# Patient Record
Sex: Female | Born: 2000 | Race: White | Hispanic: No | Marital: Single
Health system: Southern US, Community
[De-identification: ages and names within clinical notes are randomized; demographics above are authoritative.]

---
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
## 2024-02-07 ENCOUNTER — Ambulatory Visit (HOSPITAL_COMMUNITY)
Admission: EM | Admit: 2024-02-07 | Discharge: 2024-02-08 | Disposition: A | Discharge status: Other Acute Inpatient Hospital | Attending: Nurse Practitioner | Admitting: Nurse Practitioner

## 2024-02-07 DIAGNOSIS — F332 Major depressive disorder, recurrent severe without psychotic features: Secondary | ICD-10-CM | POA: Insufficient documentation

## 2024-02-07 DIAGNOSIS — R45851 Suicidal ideations: Secondary | ICD-10-CM | POA: Insufficient documentation

## 2024-02-07 MED ORDER — ALUM & MAG HYDROXIDE-SIMETH 200-200-20 MG/5ML PO SUSP: 30.0000 mL | ORAL | Status: DC | PRN

## 2024-02-07 MED ORDER — HYDROXYZINE HCL 25 MG PO TABS: 25.0000 mg | ORAL_TABLET | Freq: Three times a day (TID) | ORAL | Status: DC | PRN

## 2024-02-07 MED ORDER — TRAZODONE HCL 50 MG PO TABS: 50.0000 mg | ORAL_TABLET | Freq: Every evening | ORAL | Status: DC | PRN

## 2024-02-07 MED ORDER — LORAZEPAM 2 MG/ML IJ SOLN: 2.0000 mg | Freq: Three times a day (TID) | INTRAMUSCULAR | Status: DC | PRN

## 2024-02-07 MED ORDER — HALOPERIDOL LACTATE 5 MG/ML IJ SOLN: 10.0000 mg | Freq: Three times a day (TID) | INTRAMUSCULAR | Status: DC | PRN

## 2024-02-07 MED ORDER — HALOPERIDOL 5 MG PO TABS: 5.0000 mg | ORAL_TABLET | Freq: Three times a day (TID) | ORAL | Status: DC | PRN

## 2024-02-07 MED ORDER — DIPHENHYDRAMINE HCL 50 MG/ML IJ SOLN: 50.0000 mg | Freq: Three times a day (TID) | INTRAMUSCULAR | Status: DC | PRN

## 2024-02-07 MED ORDER — MAGNESIUM HYDROXIDE 400 MG/5ML PO SUSP: 30.0000 mL | Freq: Every day | ORAL | Status: DC | PRN

## 2024-02-07 MED ORDER — HALOPERIDOL LACTATE 5 MG/ML IJ SOLN: 5.0000 mg | Freq: Three times a day (TID) | INTRAMUSCULAR | Status: DC | PRN

## 2024-02-07 MED ORDER — DIPHENHYDRAMINE HCL 50 MG PO CAPS: 50.0000 mg | ORAL_CAPSULE | Freq: Three times a day (TID) | ORAL | Status: DC | PRN

## 2024-02-07 MED ORDER — ACETAMINOPHEN 325 MG PO TABS: 650.0000 mg | ORAL_TABLET | Freq: Four times a day (QID) | ORAL | Status: DC | PRN

## 2024-02-07 NOTE — BH Assessment (Incomplete)
[id number] 
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
## 2024-02-08 ENCOUNTER — Inpatient Hospital Stay (HOSPITAL_COMMUNITY)
Admission: AD | Admit: 2024-02-08 | Discharge: 2024-02-12 | DRG: 885 | Disposition: A | Discharge status: Home / Self Care | Source: Intra-hospital

## 2024-02-08 ENCOUNTER — Encounter (HOSPITAL_COMMUNITY): Payer: Self-pay | Admitting: Nurse Practitioner

## 2024-02-08 ENCOUNTER — Other Ambulatory Visit: Payer: Self-pay

## 2024-02-08 DIAGNOSIS — Z818 Family history of other mental and behavioral disorders: Secondary | ICD-10-CM | POA: Diagnosis not present

## 2024-02-08 DIAGNOSIS — Z9152 Personal history of nonsuicidal self-harm: Secondary | ICD-10-CM | POA: Diagnosis not present

## 2024-02-08 DIAGNOSIS — Z79899 Other long term (current) drug therapy: Secondary | ICD-10-CM

## 2024-02-08 DIAGNOSIS — R45851 Suicidal ideations: Secondary | ICD-10-CM | POA: Diagnosis present

## 2024-02-08 DIAGNOSIS — F332 Major depressive disorder, recurrent severe without psychotic features: Principal | ICD-10-CM | POA: Diagnosis present

## 2024-02-08 LAB — COMPREHENSIVE METABOLIC PANEL WITH GFR
ALT: 17 U/L (ref 0–44)
AST: 19 U/L (ref 15–41)
Albumin: 3.9 g/dL (ref 3.5–5.0)
Alkaline Phosphatase: 50 U/L (ref 38–126)
Anion gap: 11 (ref 5–15)
BUN: 6 mg/dL (ref 6–20)
CO2: 23 mmol/L (ref 22–32)
Calcium: 9.1 mg/dL (ref 8.9–10.3)
Chloride: 105 mmol/L (ref 98–111)
Creatinine, Ser: 0.55 mg/dL (ref 0.44–1.00)
GFR, Estimated: >60 mL/min (ref >60)
Glucose, Bld: 85 mg/dL (ref 70–99)
Potassium: 3.8 mmol/L (ref 3.5–5.1)
Sodium: 139 mmol/L (ref 135–145)
Total Bilirubin: 1.4 mg/dL — ABNORMAL HIGH (ref 0.0–1.2)
Total Protein: 7.5 g/dL (ref 6.5–8.1)

## 2024-02-08 LAB — POCT URINE DRUG SCREEN - MANUAL ENTRY (I-SCREEN)
POC Amphetamine UR: NOT DETECTED
POC Buprenorphine (BUP): NOT DETECTED
POC Cocaine UR: NOT DETECTED
POC Marijuana UR: NOT DETECTED
POC Methadone UR: NOT DETECTED
POC Methamphetamine UR: NOT DETECTED
POC Morphine: NOT DETECTED
POC Oxazepam (BZO): NOT DETECTED
POC Oxycodone UR: NOT DETECTED
POC Secobarbital (BAR): NOT DETECTED

## 2024-02-08 LAB — MAGNESIUM: Magnesium: 2 mg/dL (ref 1.7–2.4)

## 2024-02-08 LAB — CBC WITH DIFFERENTIAL/PLATELET
Abs Immature Granulocytes: 0.01 K/uL (ref 0.00–0.07)
Basophils Absolute: 0.1 K/uL (ref 0.0–0.1)
Basophils Relative: 1 %
Eosinophils Absolute: 0.4 K/uL (ref 0.0–0.5)
Eosinophils Relative: 6 %
HCT: 38.5 % (ref 36.0–46.0)
Hemoglobin: 12.9 g/dL (ref 12.0–15.0)
Immature Granulocytes: 0 %
Lymphocytes Relative: 41 %
Lymphs Abs: 2.8 K/uL (ref 0.7–4.0)
MCH: 30.3 pg (ref 26.0–34.0)
MCHC: 33.5 g/dL (ref 30.0–36.0)
MCV: 90.4 fL (ref 80.0–100.0)
Monocytes Absolute: 0.5 K/uL (ref 0.1–1.0)
Monocytes Relative: 8 %
Neutro Abs: 3.1 K/uL (ref 1.7–7.7)
Neutrophils Relative %: 44 %
Platelets: 289 K/uL (ref 150–400)
RBC: 4.26 MIL/uL (ref 3.87–5.11)
RDW: 12.6 % (ref 11.5–15.5)
WBC: 7 K/uL (ref 4.0–10.5)
nRBC: 0 % (ref 0.0–0.2)

## 2024-02-08 LAB — ETHANOL: Alcohol, Ethyl (B): <15 mg/dL (ref <15)

## 2024-02-08 LAB — TSH: TSH: 2.543 u[IU]/mL (ref 0.350–4.500)

## 2024-02-08 LAB — LIPID PANEL
Cholesterol: 157 mg/dL (ref 0–200)
HDL: 49 mg/dL (ref >40)
LDL Cholesterol: 102 mg/dL — ABNORMAL HIGH (ref 0–99)
Total CHOL/HDL Ratio: 3.2 ratio
Triglycerides: 30 mg/dL (ref <150)
VLDL: 6 mg/dL (ref 0–40)

## 2024-02-08 LAB — POC URINE PREG, ED: Preg Test, Ur: NEGATIVE

## 2024-02-08 MED ORDER — TRAZODONE HCL 50 MG PO TABS
50.0000 mg | ORAL_TABLET | Freq: Every evening | ORAL | Status: DC | PRN
  Administered 2024-02-08 – 2024-02-11 (×4): 50 mg via ORAL
  Filled 2024-02-08 (×5): qty 1

## 2024-02-08 MED ORDER — DIPHENHYDRAMINE HCL 50 MG/ML IJ SOLN: 50.0000 mg | Freq: Three times a day (TID) | INTRAMUSCULAR | Status: DC | PRN

## 2024-02-08 MED ORDER — LORAZEPAM 2 MG/ML IJ SOLN: 2.0000 mg | Freq: Three times a day (TID) | INTRAMUSCULAR | Status: DC | PRN

## 2024-02-08 MED ORDER — ACETAMINOPHEN 325 MG PO TABS
650.0000 mg | ORAL_TABLET | Freq: Four times a day (QID) | ORAL | Status: DC | PRN
  Administered 2024-02-08: 650 mg via ORAL
  Filled 2024-02-08: qty 2

## 2024-02-08 MED ORDER — MAGNESIUM HYDROXIDE 400 MG/5ML PO SUSP: 30.0000 mL | Freq: Every day | ORAL | Status: DC | PRN

## 2024-02-08 MED ORDER — HYDROXYZINE HCL 25 MG PO TABS
25.0000 mg | ORAL_TABLET | Freq: Three times a day (TID) | ORAL | Status: DC | PRN
  Administered 2024-02-08 – 2024-02-11 (×4): 25 mg via ORAL
  Filled 2024-02-08 (×6): qty 1

## 2024-02-08 MED ORDER — DIPHENHYDRAMINE HCL 25 MG PO CAPS: 50.0000 mg | ORAL_CAPSULE | Freq: Three times a day (TID) | ORAL | Status: DC | PRN

## 2024-02-08 MED ORDER — ALUM & MAG HYDROXIDE-SIMETH 200-200-20 MG/5ML PO SUSP: 30.0000 mL | ORAL | Status: DC | PRN

## 2024-02-08 MED ORDER — ESCITALOPRAM OXALATE 20 MG PO TABS
20.0000 mg | ORAL_TABLET | Freq: Every day | ORAL | Status: DC
  Administered 2024-02-09 – 2024-02-10 (×2): 20 mg via ORAL
  Filled 2024-02-08 (×2): qty 2

## 2024-02-08 MED ORDER — HALOPERIDOL LACTATE 5 MG/ML IJ SOLN: 5.0000 mg | Freq: Three times a day (TID) | INTRAMUSCULAR | Status: DC | PRN

## 2024-02-08 MED ORDER — HALOPERIDOL LACTATE 5 MG/ML IJ SOLN: 10.0000 mg | Freq: Three times a day (TID) | INTRAMUSCULAR | Status: DC | PRN

## 2024-02-08 MED ORDER — BUPROPION HCL ER (XL) 150 MG PO TB24
150.0000 mg | ORAL_TABLET | Freq: Every day | ORAL | Status: DC
  Administered 2024-02-08 – 2024-02-12 (×5): 150 mg via ORAL
  Filled 2024-02-08 (×5): qty 1

## 2024-02-08 MED ORDER — ESCITALOPRAM OXALATE 10 MG PO TABS
20.0000 mg | ORAL_TABLET | Freq: Every day | ORAL | Status: DC
  Filled 2024-02-08: qty 2

## 2024-02-08 MED ORDER — ESCITALOPRAM OXALATE 10 MG PO TABS
20.0000 mg | ORAL_TABLET | Freq: Every day | ORAL | Status: DC
  Administered 2024-02-08: 20 mg via ORAL

## 2024-02-08 MED ORDER — HALOPERIDOL 5 MG PO TABS: 5.0000 mg | ORAL_TABLET | Freq: Three times a day (TID) | ORAL | Status: DC | PRN

## 2024-02-08 NOTE — Group Note (Signed)
 Date:  02/08/2024 Time:  4:21 PM  Group Topic/Focus:  Overcoming Stress:   The focus of this group are the benefits of gratitude on stress.    Participation Level:  Active  Participation Quality:  Appropriate  Affect:  Appropriate  Cognitive:  Appropriate  Insight: Appropriate  Engagement in Group:  Engaged  Modes of Intervention:  Discussion    Vanessa Burgess 02/08/2024, 4:21 PM

## 2024-02-08 NOTE — ED Notes (Signed)
 Pt observed/assessed in recliner sleeping. RR even and unlabored, appearing in no noted distress. Environmental check complete, will continue to monitor for safety

## 2024-02-08 NOTE — ED Notes (Signed)
Report called to Mateo Flow, RN at [facility]

## 2024-02-08 NOTE — Progress Notes (Signed)
   02/08/24 2133  Psych Admission Type (Psych Patients Only)  Admission Status Voluntary  Psychosocial Assessment  Patient Complaints Depression  Eye Contact Fair  Facial Expression Animated  Affect Appropriate to circumstance  Speech Logical/coherent  Interaction Assertive  Motor Activity Other (Comment) (WDL)  Appearance/Hygiene Unremarkable  Behavior Characteristics Appropriate to situation  Mood Depressed;Pleasant  Thought Process  Coherency WDL  Content WDL  Delusions None reported or observed  Perception WDL  Hallucination None reported or observed  Judgment Poor  Confusion None  Danger to Self  Current suicidal ideation? Denies  Agreement Not to Harm Self Yes  Description of Agreement verbal  Danger to Others  Danger to Others None reported or observed

## 2024-02-08 NOTE — Tx Team (Signed)
 Initial Treatment Plan 02/08/2024 6:59 PM [name] FMW:[id number]    PATIENT STRESSORS: Marital or family conflict   Occupational concerns   Other: world events     PATIENT STRENGTHS: Average or above average intelligence  Communication skills  General fund of knowledge  Motivation for treatment/growth  Physical Health  Supportive family/friends    PATIENT IDENTIFIED PROBLEMS: Suicide ideation     World events    Increased depression    Family issues         DISCHARGE CRITERIA:  Improved stabilization in mood, thinking, and/or behavior Need for constant or close observation no longer present Reduction of life-threatening or endangering symptoms to within safe limits Verbal commitment to aftercare and medication compliance  PRELIMINARY DISCHARGE PLAN: Outpatient therapy Return to previous living arrangement Return to previous work or school arrangements  PATIENT/FAMILY INVOLVEMENT: This treatment plan has been presented to and reviewed with the patient, Vanessa Burgess, .  The patient has been given the opportunity to ask questions and make suggestions.  Berwyn GORMAN Acosta, RN 02/08/2024, 6:59 PM

## 2024-02-08 NOTE — Plan of Care (Signed)
   Problem: Education: Goal: Knowledge of [facility] Education information/materials will improve Outcome: Progressing Goal: Verbalization of understanding the information provided will improve Outcome: Progressing

## 2024-02-08 NOTE — ED Notes (Signed)
 Report has been given to receiving nurse at [facility], this nurse attempted x2 to call safe transport to transport patient to [facility], phone went to voice mail both times.

## 2024-02-08 NOTE — H&P (Signed)
 Psychiatric Admission Assessment Adult  Patient Identification: Vanessa Burgess MRN:  [id number] Date of Evaluation:  02/08/2024 Chief Complaint:  MDD (major depressive disorder), recurrent episode, severe (HCC) [F33.2] Principal Diagnosis: MDD (major depressive disorder), recurrent episode, severe (HCC) Diagnosis:  Principal Problem:   MDD (major depressive disorder), recurrent episode, severe (HCC)  History of Present Illness:   Vanessa Burgess is a 23 y/o female presented to [facility] as a walk-in voluntarily  and unaccompanied with complaints of suicidal ideation with a plan to drive her car off a bridge. Patient reports that she has been feeling, depressed,irritable and angry, no motivation, and poor energy daily.  The patient has struggled with depression throughout HS and early adulthood. Symptoms have worsened over the last few weeks. Stressors are vague: does not like her degree and events in the world. Adherent to her Lexapro 20mg  consistently. No Hx of substance abuse, mania or psychosis. Will continue home Lexapro and start Wellburrin 150mg  XL for depression. The patient identifies as non-binary.  On my assessment the patient is calm, friendly and interacts well with me.  She reports her mood is depressed and affect is restricted.  She informs me that over the last several months she has lost interest in the studies at her associates degree and is not interested in continuing to pursue the degree that she is in.  The patient also complains of political events in the world and is not happy with these events.  She states that the 2 of these issues combined are causing her to be more depressed.  Symptoms of depression that she reports are anhedonia, low energy, fatigue, poor concentration, low mood and suicidal ideations.  Sleep and appetite are both stable.  Continues to endorse suicidal ideations though they are passive today.  she endorsed developing suicidal ideations with a plan  to have her car off of a bridge.  She reports longstanding depression since her teenage years and has attended therapy intermittently throughout her teenage and early adult life.  Her current medication regimen is Zoloft 20 mg which she is adherent to.  The patient denies any previous history of psychosis or mania.  She denies any symptoms consistent with anxiety or OCD.  She has no history of trauma and has no major medical issues.  Urine drug screen was negative and the patient denies doing illicit substances or alcohol.  Her goals are to improve her depression as well as get involved in psychotherapy.  She would like to supplement her Lexapro we discussed addition of Wellbutrin.  I discussed risk, benefits and side effects to include GI distress, headache, increased blood pressure and rare risk of seizures.  Patient understands and agrees to start this medicine.  Will start Wellbutrin 150 mg XL.  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 -bio father deceased -bio mother, no relationship, has bipolar   Substance History -UDS neg, denies all use  Family History -mother has Bipolar D/o -father deceased  Medical History -denies seizure, TBI and stroke   Associated Signs/Symptoms: Depression Symptoms:  depressed mood, anhedonia, psychomotor retardation, fatigue, feelings of worthlessness/guilt, difficulty concentrating, hopelessness, suicidal thoughts with specific plan, loss of energy/fatigue, (Hypo) Manic Symptoms:  n/a Anxiety Symptoms:  n/a  Psychotic Symptoms:  n/a PTSD Symptoms: Negative Total Time spent with patient: 1 hour    Is the patient at risk to self? Yes.     Has the patient been a risk to self in the past 6 months? Yes.    Has the patient been a risk to self within the distant past? Yes.    Is the patient a risk to others? No.  Has the patient been a risk to others in the past 6 months? No.  Has the patient been a risk to others within the distant past? No.   Columbia Scale:  Flowsheet Row ED from 02/07/2024 in [facility]  C-SSRS RISK CATEGORY No Risk     Prior Inpatient Therapy: No.  Prior Outpatient Therapy: Yes.    Alcohol Screening:   Substance Abuse History in the last 12 months:  No. Consequences of Substance Abuse: Negative Previous Psychotropic Medications: Yes  Psychological Evaluations: No  Past Medical History: No past medical history on file.  Family History: No family history on file.   Tobacco Screening:  Social History   Tobacco Use  Smoking Status Not on file  Smokeless Tobacco Not on file    BH Tobacco Counseling     Are you interested in Tobacco Cessation Medications?  No value filed. Counseled patient on smoking cessation:  No value filed. Reason Tobacco Screening Not Completed: No value filed.       Social History:  Social History   Substance and Sexual Activity  Alcohol Use Not on file     Social History   Substance and Sexual Activity  Drug Use Not on file    Additional Social History:                           Allergies:  No Known Allergies Lab Results:  Results for orders placed or performed during the hospital encounter of 02/07/24 (from the past 48 hours)  CBC with Differential/Platelet     Status: None   Collection Time: 02/08/24 12:20 AM  Result Value Ref Range   WBC 7.0 4.0 - 10.5 K/uL   RBC 4.26 3.87 - 5.11 MIL/uL   Hemoglobin 12.9 12.0 - 15.0 g/dL   HCT 61.4 63.9 - 53.9 %   MCV 90.4 80.0 - 100.0 fL   MCH 30.3 26.0 - 34.0 pg   MCHC 33.5 30.0 - 36.0 g/dL   RDW 87.3 88.4 - 84.4 %   Platelets 289 150 - 400 K/uL   nRBC 0.0 0.0 - 0.2 %    Neutrophils Relative % 44 %   Neutro Abs 3.1 1.7 - 7.7 K/uL   Lymphocytes Relative 41 %   Lymphs Abs 2.8 0.7 - 4.0 K/uL   Monocytes Relative 8 %   Monocytes Absolute 0.5 0.1 - 1.0 K/uL   Eosinophils Relative 6 %   Eosinophils Absolute 0.4 0.0 - 0.5 K/uL   Basophils Relative 1 %   Basophils Absolute 0.1 0.0 - 0.1 K/uL   Immature Granulocytes 0 %   Abs Immature Granulocytes 0.01 0.00 - 0.07 K/uL    Comment: Performed at [facility], [address] [place], [place] [address]  Comprehensive metabolic panel     Status: Abnormal   Collection Time: 02/08/24 12:20 AM  Result Value Ref Range   Sodium 139 135 - 145 mmol/L   Potassium 3.8 3.5 - 5.1 mmol/L   Chloride 105 98 - 111 mmol/L  CO2 23 22 - 32 mmol/L   Glucose, Bld 85 70 - 99 mg/dL    Comment: Glucose reference range applies only to samples taken after fasting for at least 8 hours.   BUN 6 6 - 20 mg/dL   Creatinine, Ser 9.44 0.44 - 1.00 mg/dL   Calcium 9.1 8.9 - 89.6 mg/dL   Total Protein 7.5 6.5 - 8.1 g/dL   Albumin 3.9 3.5 - 5.0 g/dL   AST 19 15 - 41 U/L   ALT 17 0 - 44 U/L   Alkaline Phosphatase 50 38 - 126 U/L   Total Bilirubin 1.4 (H) 0.0 - 1.2 mg/dL   GFR, Estimated >39 >39 mL/min    Comment: (NOTE) Calculated using the CKD-EPI Creatinine Equation (2021)    Anion gap 11 5 - 15    Comment: Performed at [facility], [address], [place], [place] [address]  Magnesium     Status: None   Collection Time: 02/08/24 12:20 AM  Result Value Ref Range   Magnesium 2.0 1.7 - 2.4 mg/dL    Comment: Performed at [facility], [address], [place], [place] [address]  Ethanol     Status: None   Collection Time: 02/08/24 12:20 AM  Result Value Ref Range   Alcohol, Ethyl (B) <15 <15 mg/dL    Comment: (NOTE) For medical purposes only. Performed at [facility], [address], [place], [place] [address]   Lipid panel     Status: Abnormal   Collection Time: 02/08/24 12:20 AM  Result Value Ref  Range   Cholesterol 157 0 - 200 mg/dL   Triglycerides 30 <849 mg/dL   HDL 49 >59 mg/dL   Total CHOL/HDL Ratio 3.2 RATIO   VLDL 6 0 - 40 mg/dL   LDL Cholesterol 897 (H) 0 - 99 mg/dL    Comment:        Total Cholesterol/HDL:CHD Risk Coronary Heart Disease Risk Table                     Men   Women  1/2 Average Risk   3.4   3.3  Average Risk       5.0   4.4  2 X Average Risk   9.6   7.1  3 X Average Risk  23.4   11.0        Use the calculated Patient Ratio above and the CHD Risk Table to determine the patient's CHD Risk.        ATP III CLASSIFICATION (LDL):  <100     mg/dL   Optimal  899-870  mg/dL   Near or Above                    Optimal  130-159  mg/dL   Borderline  839-810  mg/dL   High  >809     mg/dL   Very High Performed at [facility], [address], [place], [place] [address]   TSH     Status: None   Collection Time: 02/08/24 12:20 AM  Result Value Ref Range   TSH 2.543 0.350 - 4.500 uIU/mL    Comment: Performed by a 3rd Generation assay with a functional sensitivity of <=0.01 uIU/mL. Performed at [facility], [address], [place], [place] [address]   POCT Urine Drug Screen - (I-Screen)     Status: Normal   Collection Time: 02/08/24  7:10 AM  Result Value Ref Range   POC Amphetamine UR None Detected NONE DETECTED (Cut Off Level 1000 ng/mL)   POC Secobarbital (BAR) None Detected NONE DETECTED (Cut Off Level 300 ng/mL)   POC Buprenorphine (BUP) None Detected NONE DETECTED (Cut Off Level 10 ng/mL)   POC Oxazepam (BZO) None Detected NONE DETECTED (Cut Off Level 300 ng/mL)   POC Cocaine UR None Detected NONE DETECTED (Cut Off Level 300 ng/mL)   POC Methamphetamine UR None Detected NONE DETECTED (Cut Off Level 1000 ng/mL)   POC Morphine None Detected NONE DETECTED (Cut Off Level 300 ng/mL)   POC Methadone UR None Detected NONE DETECTED (Cut Off Level 300 ng/mL)   POC Oxycodone UR None Detected NONE DETECTED (Cut Off Level 100 ng/mL)   POC Marijuana  UR None Detected NONE DETECTED (Cut Off Level 50 ng/mL)  POC urine preg, ED     Status: None   Collection Time: 02/08/24  7:10 AM  Result Value Ref Range   Preg Test, Ur Negative Negative    Blood Alcohol level:  Lab Results  Component Value Date   [facility] <15 02/08/2024    Metabolic Disorder Labs:  No results found for: HGBA1C, MPG No results found for: PROLACTIN Lab Results  Component Value Date   CHOL 157 02/08/2024   TRIG 30 02/08/2024   HDL 49 02/08/2024   CHOLHDL 3.2 02/08/2024   VLDL 6 02/08/2024   LDLCALC 102 (H) 02/08/2024    Current Medications: Current Facility-Administered Medications  Medication Dose Route Frequency Provider Last Rate Last Admin   acetaminophen (TYLENOL) tablet 650 mg  650 mg Oral Q6H PRN Bobbitt, Shalon E, NP       alum & mag hydroxide-simeth (MAALOX/MYLANTA) 200-200-20 MG/5ML suspension 30 mL  30 mL Oral Q4H PRN Bobbitt, Shalon E, NP       buPROPion (WELLBUTRIN XL) 24 hr tablet 150 mg  150 mg Oral Daily Chandra Charleston Christian Lee, DO       haloperidol (HALDOL) tablet 5 mg  5 mg Oral TID PRN Bobbitt, Shalon E, NP       And   diphenhydrAMINE (BENADRYL) capsule 50 mg  50 mg Oral TID PRN Bobbitt, Shalon E, NP       haloperidol lactate (HALDOL) injection 5 mg  5 mg Intramuscular TID PRN Bobbitt, Shalon E, NP       And   diphenhydrAMINE (BENADRYL) injection 50 mg  50 mg Intramuscular TID PRN Bobbitt, Shalon E, NP       And   LORazepam (ATIVAN) injection 2 mg  2 mg Intramuscular TID PRN Bobbitt, Shalon E, NP       haloperidol lactate (HALDOL) injection 10 mg  10 mg Intramuscular TID PRN Bobbitt, Shalon E, NP       And   diphenhydrAMINE (BENADRYL) injection 50 mg  50 mg Intramuscular TID PRN Bobbitt, Shalon E, NP       And   LORazepam (ATIVAN) injection 2 mg  2 mg Intramuscular TID PRN Bobbitt, Shalon E, NP       [START ON 02/09/2024] escitalopram (LEXAPRO) tablet 20 mg  20 mg Oral Daily Brent, Amanda C, NP       hydrOXYzine (ATARAX) tablet 25  mg  25 mg Oral TID PRN Bobbitt, Shalon E, NP       magnesium hydroxide (MILK OF MAGNESIA) suspension 30 mL  30 mL Oral Daily PRN Bobbitt, Shalon E, NP       traZODone (DESYREL) tablet 50 mg  50  mg Oral QHS PRN Bobbitt, Shalon E, NP       PTA Medications: No medications prior to admission.    AIMS:  ,  ,  ,  ,  ,  ,    Musculoskeletal: Strength & Muscle Tone: within normal limits Gait & Station: normal Patient leans: N/A            Psychiatric Specialty Exam:  Presentation  General Appearance:  Casual  Eye Contact: Fair  Speech: Clear and Coherent  Speech Volume: Normal  Handedness: Right   Mood and Affect  Mood: Depressed  Affect: Congruent   Thought Process  Thought Processes: Coherent  Duration of Psychotic Symptoms:life long Past Diagnosis of Schizophrenia or Psychoactive disorder: No  Descriptions of Associations:Intact  Orientation:Full (Time, Place and Person)  Thought Content:WDL  Hallucinations:Hallucinations: None  Ideas of Reference:None  Suicidal Thoughts:Suicidal Thoughts: Yes, Active SI Active Intent and/or Plan: With Intent; With Plan (to drive car off bridge)  Homicidal Thoughts:Homicidal Thoughts: No   Sensorium  Memory: Immediate Good; Recent Good; Remote Good  Judgment: Fair  Insight: Fair   Chartered Certified Accountant: Fair  Attention Span: Fair  Recall: [employer] of Knowledge: Fair  Language: Fair   Psychomotor Activity  Psychomotor Activity: Psychomotor Activity: Normal   Assets  Assets: Manufacturing Systems Engineer; Housing; Physical Health   Sleep  Sleep: Sleep: Good  Estimated Sleeping Duration (Last 24 Hours): 0.00 hours (Due to Daylight Saving Time, the duration displayed may not accurately represent documentation during the time change interval)   Physical Exam: Physical Exam Constitutional:      Appearance: Normal appearance. She is normal weight.  HENT:     Head:  Normocephalic and atraumatic.     Nose: Nose normal.  Musculoskeletal:        General: Normal range of motion.     Cervical back: Normal range of motion and neck supple.  Neurological:     General: No focal deficit present.     Mental Status: She is alert and oriented to person, place, and time. Mental status is at baseline.  Psychiatric:        Behavior: Behavior normal.        Thought Content: Thought content normal.        Judgment: Judgment normal.    ROS Blood pressure 110/78, pulse 72, temperature 98.5 F (36.9 C), temperature source Oral, resp. rate 16, height 5' 4 (1.626 m), weight 77.1 kg, SpO2 100%. Body mass index is 29.18 kg/m.    Diagnosis:   Principal Problem:   -MDD (major depressive disorder), recurrent episode, severe (HCC)   Cydni Riannah Stagner is a 23 y/o female presented to [facility] as a walk in voluntarily  and unaccompanied with complaints of suicidal ideation with a plan to drive her car off a bridge. Patient reports that she has been feeling, depressed,irritable and angry, no motivation, and poor energy daily. The patient has struggled with depression throughout HS and early adulthood. Symptoms have worsened over the last few weeks. Stressors are vague: does not like her degree and events in the world. Adherent to her Lexapro 20mg  consistently. No Hx of substance abuse, mania or psychosis. Will continue home Lexapro and start Wellburrin 150mg  XL for depression.    Treatment Plan Summary: Daily contact with patient to assess and evaluate symptoms and progress in treatment  Observation Level/Precautions:  15 minute checks  Laboratory:    -CMP WNL -CBC WNL -TSH 2.5 -UDS neg -BAL neg -Preg neg  Psychotherapy:  Group Therapy  Medications:    -Start Wellbutrin 150 mg XL -Continue home Lexapro 20mg      Consultations:  SW  Discharge Concerns:  SI  Estimated LOS:4-5 days  Other:     Physician Treatment Plan for Primary Diagnosis: MDD (major  depressive disorder), recurrent episode, severe (HCC) Long Term Goal(s): Improvement in symptoms so as ready for discharge  Short Term Goals: Ability to identify changes in lifestyle to reduce recurrence of condition will improve, Ability to verbalize feelings will improve, Ability to disclose and discuss suicidal ideas, Ability to demonstrate self-control will improve, and Ability to identify and develop effective coping behaviors will improve  Physician Treatment Plan for Secondary Diagnosis: Principal Problem:   MDD (major depressive disorder), recurrent episode, severe (HCC)  Long Term Goal(s): Improvement in symptoms so as ready for discharge  Short Term Goals: Ability to identify changes in lifestyle to reduce recurrence of condition will improve, Ability to verbalize feelings will improve, Ability to disclose and discuss suicidal ideas, Ability to demonstrate self-control will improve, and Ability to identify and develop effective coping behaviors will improve  I certify that inpatient services furnished can reasonably be expected to improve the patient's condition.    Lamar Handler Jama Slain, DO 11/9/20252:10 PM

## 2024-02-08 NOTE — ED Notes (Signed)
 D- Patient alert and oriented. Denies SI, HI, AVH, and pain.   A- Support and encouragement provided.  Routine safety checks conducted every 15 minutes.  Patient informed to notify staff with problems or concerns.  R- Patient contracts for safety at this time. Patient compliant with medications and treatment plan. Patient receptive, calm, and cooperative. Patient remains safe at this time.
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
## 2024-02-08 NOTE — Progress Notes (Signed)
 Patient is a 23 year old female- identifying as NB (they, them) who presented to [facility] from the [facility] under voluntary admission due to complaints of SI with a plan to drive their car off of a bridge. Pt reported several stressors: world events, school, and relationship issues. 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 Pt denied that they use illicit substances, and reported that they drink approximately once a week (2 drinks,e.g., wine, beer). Pt presented with a flat affect, depressed mood, calm, cooperative behavior, answered questions logically and coherently during admission interview and assessment. Admission paperwork completed and signed. Verbal understanding expressed. VS monitored and recorded. Skin check performed with MHT Belongings searched and secured in locker. Patient was oriented to unit and schedule. Pt denies SI/HI/AVH at this time. PO fluids provided.Q 15 min checks initiated for safety.

## 2024-02-08 NOTE — Discharge Instructions (Signed)
 Pt transferred to [facility] for inpatient treatment.
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
## 2024-02-08 NOTE — ED Notes (Signed)
 RN spoke with patient A&Ox4. Denies intent to harm self/others when asked. Denies A/VH or any physical complaints when asked. No acute distress noted. Active listening, support and encouragement provided. Routine safety checks conducted according to facility protocol. Encouraged patient to notify staff if thoughts of harm toward self or others arise. Patient verbalize understanding and agreement.

## 2024-02-08 NOTE — [facility] Group Notes (Signed)
 [facility] Group Notes:  (Nursing/MHT/Case Management/Adjunct)  Date:  02/08/2024  Time:  11:40 PM  Type of Therapy:  Group Therapy  Participation Level:  Active  Participation Quality:  Appropriate  Affect:  Appropriate  Cognitive:  Alert and Appropriate  Insight:  Appropriate and Good  Engagement in Group:  Supportive  Modes of Intervention:  Socialization  Summary of Progress/Problems:Pt attended group.   Optional questions asked: How do you feel about the idea of forgiving yourself for the past mistakes or difficult times?,  what is something you wish others understood about you ?, and Using the first  3 letters in your name, describe yourself.  Pt went over in group with other peers about Forgiveness and Self-Compassion, Understanding and Connection and Self-Identity in Three Letters.   Vanessa Burgess 02/08/2024, 11:40 PM

## 2024-02-08 NOTE — BHH Suicide Risk Assessment (Signed)
 [facility] Admission Suicide Risk Assessment   Nursing information obtained from:    Demographic factors:    Current Mental Status:    Loss Factors:    Historical Factors:    Risk Reduction Factors:     Total Time spent with patient: 30 minutes Principal Problem: MDD (major depressive disorder), recurrent episode, severe (HCC) Diagnosis:  Principal Problem:   MDD (major depressive disorder), recurrent episode, severe (HCC)  Subjective Data: Pt presented with several weeks of worsening depression and suicidal thoughts that developed into a method and plan.   Continued Clinical Symptoms:    The Alcohol Use Disorders Identification Test, Guidelines for Use in Primary Care, Second Edition.  World Science Writer [facility]). Score between 0-7:  no or low risk or alcohol related problems. Score between 8-15:  moderate risk of alcohol related problems. Score between 16-19:  high risk of alcohol related problems. Score 20 or above:  warrants further diagnostic evaluation for alcohol dependence and treatment.   CLINICAL FACTORS:   Depression:   Anhedonia Hopelessness Severe   Musculoskeletal: Strength & Muscle Tone: within normal limits Gait & Station: normal Patient leans: N/A  Psychiatric Specialty Exam:  Presentation  General Appearance:  Casual  Eye Contact: Fair  Speech: Clear and Coherent  Speech Volume: Normal  Handedness: Right   Mood and Affect  Mood: Depressed  Affect: Congruent   Thought Process  Thought Processes: Coherent  Descriptions of Associations:Intact  Orientation:Full (Time, Place and Person)  Thought Content:WDL  History of Schizophrenia/Schizoaffective disorder:No  Duration of Psychotic Symptoms:No data recorded Hallucinations:Hallucinations: None  Ideas of Reference:None  Suicidal Thoughts:Suicidal Thoughts: Yes, Active SI Active Intent and/or Plan: With Intent; With Plan (to drive car off bridge)  Homicidal  Thoughts:Homicidal Thoughts: No   Sensorium  Memory: Immediate Good; Recent Good; Remote Good  Judgment: Fair  Insight: Fair   Chartered Certified Accountant: Fair  Attention Span: Fair  Recall: [employer] of Knowledge: Fair  Language: Fair   Psychomotor Activity  Psychomotor Activity: Psychomotor Activity: Normal   Assets  Assets: Manufacturing Systems Engineer; Housing; Physical Health   Sleep  Sleep: Sleep: Good Number of Hours of Sleep: 7    Physical Exam: Physical Exam Constitutional:      Appearance: Normal appearance. She is normal weight.  HENT:     Head: Normocephalic and atraumatic.  Musculoskeletal:        General: Normal range of motion.     Cervical back: Normal range of motion.  Neurological:     General: No focal deficit present.     Mental Status: She is alert and oriented to person, place, and time. Mental status is at baseline.  Psychiatric:        Mood and Affect: Mood normal.        Behavior: Behavior normal.        Thought Content: Thought content normal.    ROS There were no vitals taken for this visit. There is no height or weight on file to calculate BMI.   COGNITIVE FEATURES THAT CONTRIBUTE TO RISK:  None    SUICIDE RISK:   Mild:  Suicidal ideation of limited frequency, intensity, duration, and specificity.  There are no identifiable plans, no associated intent, mild dysphoria and related symptoms, good self-control (both objective and subjective assessment), few other risk factors, and identifiable protective factors, including available and accessible social support.  PLAN OF CARE: medication management, case management and group therapy  I certify that inpatient services furnished can reasonably be  expected to improve the patient's condition.   Lamar Handler Jama Slain, DO 02/08/2024, 11:52 AM

## 2024-02-09 ENCOUNTER — Encounter (HOSPITAL_COMMUNITY): Payer: Self-pay

## 2024-02-09 NOTE — Progress Notes (Signed)
(  Sleep Hours) - 7.5 hours (Any PRNs that were needed, meds refused, or side effects to meds)- Vistaril, Trazodone given (Any disturbances and when (visitation, over night)- None (Concerns raised by the patient)-  None (SI/HI/AVH)-  Denies

## 2024-02-09 NOTE — Group Note (Signed)
 Date:  02/09/2024 Time:  10:20 AM  Group Topic/Focus:  Emotional Goals and Wellness orientation  Goals Group:   The focus of this group is to help patients establish daily goals to achieve during treatment and discuss how the patient can incorporate goal setting into their daily lives to aide in recovery. Orientation:   The focus of this group is to educate the patient on the purpose and policies of crisis stabilization and provide a format to answer questions about their admission.  The group details unit policies and expectations of patients while admitted.    Participation Level:  Active  Participation Quality:  Appropriate  Affect:  Appropriate  Cognitive:  Appropriate  Insight: Appropriate  Engagement in Group:  Improving  Modes of Intervention:  Discussion and Orientation  Additional Comments:  Patient attended group   Maricela Schreur M Toron Bowring 02/09/2024, 10:20 AM

## 2024-02-09 NOTE — Plan of Care (Signed)
   Problem: Education: Goal: Emotional status will improve Outcome: Progressing Goal: Mental status will improve Outcome: Progressing

## 2024-02-09 NOTE — Group Note (Signed)
 Date:  02/09/2024 Time:  3:25 PM  Group Topic/Focus: [place] Spirituality:   The focus of this group is to discuss how one's spirituality can aide in recovery.    Participation Level:  Did Not Attend   Vanessa Burgess 02/09/2024, 3:25 PM

## 2024-02-09 NOTE — Group Note (Signed)
 Date:  02/09/2024 Time:  10:42 AM  Group Topic/Focus: Recreational Therapy  Participation Level:  Active    Additional Comments:  Patient attended  Vanessa Burgess 02/09/2024, 10:42 AM

## 2024-02-09 NOTE — Plan of Care (Signed)
   Problem: Education: Goal: Knowledge of [place] [facility] Education information/materials will improve Outcome: Progressing   Problem: Education: Goal: Verbalization of understanding the information provided will improve Outcome: Progressing

## 2024-02-09 NOTE — Group Note (Signed)
 Recreation Therapy Group Note   Group Topic:Communication  Group Date: 02/09/2024 Start Time: 0935 End Time: 1000 Facilitators: Hellen Shanley-McCall, LRT,CTRS Location: [address] Dayroom   Group Topic: Communication, Team Building, Problem Solving  Goal Area(s) Addresses:  Patient will effectively work with peer towards shared goal.  Patient will identify skills used to make activity successful.  Patient will identify how skills used during activity can be applied to reach post d/c goals.   Behavioral Response: Engaged  Intervention: STEM Activity- Glass Blower/designer  Activity: Tallest [employer]. In teams of 5-6, patients were given 11 craft pipe cleaners. Using the materials provided, patients were instructed to compete again the opposing team(s) to build the tallest free-standing structure from floor level. The activity was timed; difficulty increased by clinical research associate as production designer, theatre/television/film continued.  Systematically resources were removed with additional directions for example, placing one arm behind their back, working in silence, and shape stipulations. LRT facilitated post-activity discussion reviewing team processes and necessary communication skills involved in completion. Patients were encouraged to reflect how the skills utilized, or not utilized, in this activity can be incorporated to positively impact support systems post discharge.  Education: Pharmacist, Community, Scientist, Physiological, Discharge Planning   Education Outcome: Acknowledges education/In group clarification offered/Needs additional education.    Affect/Mood: Appropriate   Participation Level: Engaged   Participation Quality: Independent   Behavior: Appropriate   Speech/Thought Process: Focused   Insight: Good   Judgement: Good   Modes of Intervention: STEM Activity   Patient Response to Interventions:  Engaged   Education Outcome:  In group clarification offered    Clinical  Observations/Individualized Feedback: Pt was engaged and active during group activity.      Plan: Continue to engage patient in RT group sessions 2-3x/week.   Memori Sammon-McCall, LRT,CTRS 02/09/2024 11:16 AM

## 2024-02-09 NOTE — Group Note (Signed)
 Occupational Therapy Group Note  Group Topic:Coping Skills  Group Date: 02/09/2024 Start Time: 1500 End Time: 1538 Facilitators: Dot [place] MATSU, OT   Group Description: Group encouraged increased engagement and participation through discussion and activity focused on Coping Ahead. Patients were split up into teams and selected a card from a stack of positive coping strategies. Patients were instructed to act out/charade the coping skill for other peers to guess and receive points for their team. Discussion followed with a focus on identifying additional positive coping strategies and patients shared how they were going to cope ahead over the weekend while continuing hospitalization stay.  Therapeutic Goal(s): Identify positive vs negative coping strategies. Identify coping skills to be used during hospitalization vs coping skills outside of hospital/at home Increase participation in therapeutic group environment and promote engagement in treatment   Participation Level: Engaged   Participation Quality: Independent   Behavior: Appropriate   Speech/Thought Process: Relevant   Affect/Mood: Appropriate   Insight: Fair   Judgement: Fair      Modes of Intervention: Education  Patient Response to Interventions:  Attentive   Plan: Continue to engage patient in OT groups 2 - 3x/week.  02/09/2024  [place] MATSU Dot, OT  Haniyyah Sakuma, OT

## 2024-02-09 NOTE — Group Note (Signed)
 Date:  02/09/2024 Time:  4:13 PM  Group Topic/Focus: occupational therapy  Occupational therapy in mental health focuses on helping clients develop skills, habits, and routines that support emotional well-being, social participation, and daily functioning. Group therapy provides a supportive environment for peer learning, connection, and practice of coping skills.      Participation Level:  Active  Additional Comments:  patient attend  Vanessa Burgess 02/09/2024, 4:13 PM

## 2024-02-09 NOTE — BH IP Treatment Plan (Signed)
 Interdisciplinary Treatment and Diagnostic Plan Update  02/09/2024 Time of Session: 10:45am Yareth Kassadi Presswood MRN: [id number]  Principal Diagnosis: MDD (major depressive disorder), recurrent episode, severe (HCC)  Secondary Diagnoses: Principal Problem:   MDD (major depressive disorder), recurrent episode, severe (HCC)   Current Medications:  Current Facility-Administered Medications  Medication Dose Route Frequency Provider Last Rate Last Admin   acetaminophen (TYLENOL) tablet 650 mg  650 mg Oral Q6H PRN Bobbitt, Shalon E, NP   650 mg at 02/08/24 1709   alum & mag hydroxide-simeth (MAALOX/MYLANTA) 200-200-20 MG/5ML suspension 30 mL  30 mL Oral Q4H PRN Bobbitt, Shalon E, NP       buPROPion (WELLBUTRIN XL) 24 hr tablet 150 mg  150 mg Oral Daily Chandra Charleston Christian Lee, DO   150 mg at 02/09/24 9085   haloperidol (HALDOL) tablet 5 mg  5 mg Oral TID PRN Bobbitt, Shalon E, NP       And   diphenhydrAMINE (BENADRYL) capsule 50 mg  50 mg Oral TID PRN Bobbitt, Shalon E, NP       haloperidol lactate (HALDOL) injection 5 mg  5 mg Intramuscular TID PRN Bobbitt, Shalon E, NP       And   diphenhydrAMINE (BENADRYL) injection 50 mg  50 mg Intramuscular TID PRN Bobbitt, Shalon E, NP       And   LORazepam (ATIVAN) injection 2 mg  2 mg Intramuscular TID PRN Bobbitt, Shalon E, NP       haloperidol lactate (HALDOL) injection 10 mg  10 mg Intramuscular TID PRN Bobbitt, Shalon E, NP       And   diphenhydrAMINE (BENADRYL) injection 50 mg  50 mg Intramuscular TID PRN Bobbitt, Shalon E, NP       And   LORazepam (ATIVAN) injection 2 mg  2 mg Intramuscular TID PRN Bobbitt, Shalon E, NP       escitalopram (LEXAPRO) tablet 20 mg  20 mg Oral Daily Brent, Amanda C, NP   20 mg at 02/09/24 0914   hydrOXYzine (ATARAX) tablet 25 mg  25 mg Oral TID PRN Bobbitt, Shalon E, NP   25 mg at 02/08/24 2112   magnesium hydroxide (MILK OF MAGNESIA) suspension 30 mL  30 mL Oral Daily PRN Bobbitt, Shalon E, NP        traZODone (DESYREL) tablet 50 mg  50 mg Oral QHS PRN Bobbitt, Shalon E, NP   50 mg at 02/08/24 2112   PTA Medications: Medications Prior to Admission  Medication Sig Dispense Refill Last Dose/Taking   escitalopram (LEXAPRO) 20 MG tablet Take 20 mg by mouth daily.   02/07/2024   norethindrone-ethinyl estradiol-iron (LOESTRIN FE) 1.5-30 MG-MCG tablet Take 1 tablet by mouth daily.   Past Week    Patient Stressors: Marital or family conflict   Occupational concerns   Other: world events    Patient Strengths: Average or above average intelligence  Forensic Psychologist fund of knowledge  Motivation for treatment/growth  Physical Health  Supportive family/friends   Treatment Modalities: Medication Management, Group therapy, Case management,  1 to 1 session with clinician, Psychoeducation, Recreational therapy.   Physician Treatment Plan for Primary Diagnosis: MDD (major depressive disorder), recurrent episode, severe (HCC) Long Term Goal(s): Improvement in symptoms so as ready for discharge   Short Term Goals: Ability to identify changes in lifestyle to reduce recurrence of condition will improve Ability to verbalize feelings will improve Ability to disclose and discuss suicidal ideas Ability to demonstrate self-control will improve Ability  to identify and develop effective coping behaviors will improve  Medication Management: Evaluate patient's response, side effects, and tolerance of medication regimen.  Therapeutic Interventions: 1 to 1 sessions, Unit Group sessions and Medication administration.  Evaluation of Outcomes: Not Progressing  Physician Treatment Plan for Secondary Diagnosis: Principal Problem:   MDD (major depressive disorder), recurrent episode, severe (HCC)  Long Term Goal(s): Improvement in symptoms so as ready for discharge   Short Term Goals: Ability to identify changes in lifestyle to reduce recurrence of condition will improve Ability to verbalize  feelings will improve Ability to disclose and discuss suicidal ideas Ability to demonstrate self-control will improve Ability to identify and develop effective coping behaviors will improve     Medication Management: Evaluate patient's response, side effects, and tolerance of medication regimen.  Therapeutic Interventions: 1 to 1 sessions, Unit Group sessions and Medication administration.  Evaluation of Outcomes: Not Progressing   RN Treatment Plan for Primary Diagnosis: MDD (major depressive disorder), recurrent episode, severe (HCC) Long Term Goal(s): Knowledge of disease and therapeutic regimen to maintain health will improve  Short Term Goals: Ability to demonstrate self-control, Ability to participate in decision making will improve, and Ability to identify and develop effective coping behaviors will improve  Medication Management: RN will administer medications as ordered by provider, will assess and evaluate patient's response and provide education to patient for prescribed medication. RN will report any adverse and/or side effects to prescribing provider.  Therapeutic Interventions: 1 on 1 counseling sessions, Psychoeducation, Medication administration, Evaluate responses to treatment, Monitor vital signs and CBGs as ordered, Perform/monitor CIWA, COWS, AIMS and Fall Risk screenings as ordered, Perform wound care treatments as ordered.  Evaluation of Outcomes: Not Progressing   LCSW Treatment Plan for Primary Diagnosis: MDD (major depressive disorder), recurrent episode, severe (HCC) Long Term Goal(s): Safe transition to appropriate next level of care at discharge, Engage patient in therapeutic group addressing interpersonal concerns.  Short Term Goals: Engage patient in aftercare planning with referrals and resources, Increase ability to appropriately verbalize feelings, and Facilitate acceptance of mental health diagnosis and concerns  Therapeutic Interventions: Assess for all  discharge needs, 1 to 1 time with Social worker, Explore available resources and support systems, Assess for adequacy in community support network, Educate family and significant other(s) on suicide prevention, Complete Psychosocial Assessment, Interpersonal group therapy.  Evaluation of Outcomes: Not Progressing   Progress in Treatment: Attending groups: Yes. Participating in groups: Yes. Taking medication as prescribed: Yes. Toleration medication: Yes. Family/Significant other contact made: No, will contact:  Grndfather Patient understands diagnosis: Yes. Discussing patient identified problems/goals with staff: No. Medical problems stabilized or resolved: Yes. Denies suicidal/homicidal ideation: Yes. Issues/concerns per patient self-inventory: No. Other: n/a  New problem(s) identified: No, Describe:  n/a  New Short Term/Long Term Goal(s):  Patient Goals:  too talk to people and figure out whai I need to do when I leave  Discharge Plan or Barriers: n/a  Reason for Continuation of Hospitalization: Depression  Estimated Length of Stay:  Last 3 [place] Suicide Severity Risk Score: Flowsheet Row [facility]Current[facility]07/2024 in [facility]  C-SSRS RISK CATEGORY Moderate Risk No Risk    Last PHQ 2/9 Scores:     No data to display          Scribe for Treatment Team: Nat CHRISTELLA Salter, LCSW 02/09/2024 1:44 PM

## 2024-02-09 NOTE — Group Note (Deleted)
 Date:  02/09/2024 Time:  9:59 AM  Group Topic/Focus:  Goals Group:   The focus of this group is to help patients establish daily goals to achieve during treatment and discuss how the patient can incorporate goal setting into their daily lives to aide in recovery. Orientation:   The focus of this group is to educate the patient on the purpose and policies of crisis stabilization and provide a format to answer questions about their admission.  The group details unit policies and expectations of patients while admitted.     Participation Level:  {BHH PARTICIPATION OZCZO:77735}  Participation Quality:  {BHH PARTICIPATION QUALITY:22265}  Affect:  {BHH AFFECT:22266}  Cognitive:  {BHH COGNITIVE:22267}  Insight: {BHH Insight2:20797}  Engagement in Group:  {BHH ENGAGEMENT IN HMNLE:77731}  Modes of Intervention:  {BHH MODES OF INTERVENTION:22269}  Additional Comments:  ***  Jontez Redfield M Japheth Diekman 02/09/2024, 9:59 AM

## 2024-02-09 NOTE — Group Note (Signed)
 Date:  02/09/2024 Time:  11:26 AM  Group Topic/Focus: Emotional Wellness  is a key aspect of overall mental health, and it involves the ability to effectively manage, understand, and express emotions in a way that enhances life satisfaction and promotes well-being. For a mental health group, emotional wellness is a vital focus because it directly influences how individuals cope with challenges, handle relationships, and navigate daily stressors. Achieving emotional wellness helps group members build resilience, improve communication, and foster a sense of connectedness and self-worth.      Participation Level:  Active  Participation Quality:  Appropriate  Affect:  Appropriate  Cognitive:  Appropriate  Insight: Appropriate and Good  Engagement in Group:  Engaged and Improving  Modes of Intervention:  Discussion  Additional Comments:  Patient attended in group  [facility] M Rondell Pardon 02/09/2024, 11:26 AM
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
## 2024-02-09 NOTE — Progress Notes (Signed)
 [facility] Inpatient Psychiatry Progress Note  Date: 02/09/24 Patient: Vanessa Burgess MRN: [id number]  Assessment and Plan: Vanessa Burgess is a 23 y/o female presented to [facility] as a walk in voluntarily  and unaccompanied with complaints of suicidal ideation with a plan to drive her car off a bridge. Patient reports that she has been feeling, depressed,irritable and angry, no motivation, and poor energy daily. The patient has struggled with depression throughout HS and early adulthood. Symptoms have worsened over the last few weeks.     # MDD (major depressive disorder), recurrent episode, severe (HCC) - Escitalopram 20 mg daily - Bupropion XL 150 mg daily   Risk Assessment - Intermediate  Discharge Planning Estimated length of stay: 2-4 days Predicted Discharge location: Home    Interval History and update: Chart reviewed. No significant events overnight. Patient has been med compliant. She reported sleeping well last night and stated that she felt a bit better today compared to yesterday. She denied suicidal ideation today, though she endorses continuing depressed mood that she relates to multiple stressors, which include family stressors, academic stressors, and stress from sociopolitical events. She additionally reported that she has been experiencing constant dissociation, which she describes as being in a state of fog. She reports longstanding issues with forgetfulness but does not endorse lapses in time.       Physical Exam MSK/Neuro - Normal gait and station  Mental Status Exam Appearance - Casually dressed, appropriate hygiene and grooming  Attitude - Calm, polite, not guarded Speech - normal volume, prosody, inflection Mood - okay Affect - Restricted Thought Process - LLGD Thought Content - No delusional TC expressed SI/HI - Denies  Perceptions - Denies AVH; not RIS Judgement/Insight - Fair  Fund of knowledge - WNL Language -  No impairments      Lab Results:  No visits with results within 1 Day(s) from this visit.  Latest known visit with results is:  Admission on 02/07/2024, Discharged on 02/08/2024  Component Date Value Ref Range Status   WBC 02/08/2024 7.0  4.0 - 10.5 K/uL Final   RBC 02/08/2024 4.26  3.87 - 5.11 MIL/uL Final   Hemoglobin 02/08/2024 12.9  12.0 - 15.0 g/dL Final   HCT 88/90/7974 38.5  36.0 - 46.0 % Final   MCV 02/08/2024 90.4  80.0 - 100.0 fL Final   MCH 02/08/2024 30.3  26.0 - 34.0 pg Final   MCHC 02/08/2024 33.5  30.0 - 36.0 g/dL Final   RDW 88/90/7974 12.6  11.5 - 15.5 % Final   Platelets 02/08/2024 289  150 - 400 K/uL Final   nRBC 02/08/2024 0.0  0.0 - 0.2 % Final   Neutrophils Relative % 02/08/2024 44  % Final   Neutro Abs 02/08/2024 3.1  1.7 - 7.7 K/uL Final   Lymphocytes Relative 02/08/2024 41  % Final   Lymphs Abs 02/08/2024 2.8  0.7 - 4.0 K/uL Final   Monocytes Relative 02/08/2024 8  % Final   Monocytes Absolute 02/08/2024 0.5  0.1 - 1.0 K/uL Final   Eosinophils Relative 02/08/2024 6  % Final   Eosinophils Absolute 02/08/2024 0.4  0.0 - 0.5 K/uL Final   Basophils Relative 02/08/2024 1  % Final   Basophils Absolute 02/08/2024 0.1  0.0 - 0.1 K/uL Final   Immature Granulocytes 02/08/2024 0  % Final   Abs Immature Granulocytes 02/08/2024 0.01  0.00 - 0.07 K/uL Final   Sodium 02/08/2024 139  135 - 145 mmol/L Final  Potassium 02/08/2024 3.8  3.5 - 5.1 mmol/L Final   Chloride 02/08/2024 105  98 - 111 mmol/L Final   CO2 02/08/2024 23  22 - 32 mmol/L Final   Glucose, Bld 02/08/2024 85  70 - 99 mg/dL Final   BUN 88/90/7974 6  6 - 20 mg/dL Final   Creatinine, Ser 02/08/2024 0.55  0.44 - 1.00 mg/dL Final   Calcium 88/90/7974 9.1  8.9 - 10.3 mg/dL Final   Total Protein 88/90/7974 7.5  6.5 - 8.1 g/dL Final   Albumin 88/90/7974 3.9  3.5 - 5.0 g/dL Final   AST 88/90/7974 19  15 - 41 U/L Final   ALT 02/08/2024 17  0 - 44 U/L Final   Alkaline Phosphatase 02/08/2024 50  38 - 126 U/L  Final   Total Bilirubin 02/08/2024 1.4 (H)  0.0 - 1.2 mg/dL Final   GFR, Estimated 02/08/2024 >60  >60 mL/min Final   Anion gap 02/08/2024 11  5 - 15 Final   Magnesium 02/08/2024 2.0  1.7 - 2.4 mg/dL Final   Alcohol, Ethyl (B) 02/08/2024 <15  <15 mg/dL Final   Cholesterol 88/90/7974 157  0 - 200 mg/dL Final   Triglycerides 88/90/7974 30  <150 mg/dL Final   HDL 88/90/7974 49  >40 mg/dL Final   Total CHOL/HDL Ratio 02/08/2024 3.2  RATIO Final   VLDL 02/08/2024 6  0 - 40 mg/dL Final   LDL Cholesterol 02/08/2024 102 (H)  0 - 99 mg/dL Final   POC Amphetamine UR 02/08/2024 None Detected  NONE DETECTED (Cut Off Level 1000 ng/mL) Final   POC Secobarbital (BAR) 02/08/2024 None Detected  NONE DETECTED (Cut Off Level 300 ng/mL) Final   POC Buprenorphine (BUP) 02/08/2024 None Detected  NONE DETECTED (Cut Off Level 10 ng/mL) Final   POC Oxazepam (BZO) 02/08/2024 None Detected  NONE DETECTED (Cut Off Level 300 ng/mL) Final   POC Cocaine UR 02/08/2024 None Detected  NONE DETECTED (Cut Off Level 300 ng/mL) Final   POC Methamphetamine UR 02/08/2024 None Detected  NONE DETECTED (Cut Off Level 1000 ng/mL) Final   POC Morphine 02/08/2024 None Detected  NONE DETECTED (Cut Off Level 300 ng/mL) Final   POC Methadone UR 02/08/2024 None Detected  NONE DETECTED (Cut Off Level 300 ng/mL) Final   POC Oxycodone UR 02/08/2024 None Detected  NONE DETECTED (Cut Off Level 100 ng/mL) Final   POC Marijuana UR 02/08/2024 None Detected  NONE DETECTED (Cut Off Level 50 ng/mL) Final   Preg Test, Ur 02/08/2024 Negative  Negative Final   TSH 02/08/2024 2.543  0.350 - 4.500 uIU/mL Final     Vitals: Blood pressure 108/71, pulse 77, temperature 98.5 F (36.9 C), temperature source Oral, resp. rate 16, height 5' 4 (1.626 m), weight 77.1 kg, SpO2 100%.    [name], DO

## 2024-02-10 MED ORDER — ESCITALOPRAM OXALATE 10 MG PO TABS
10.0000 mg | ORAL_TABLET | Freq: Every day | ORAL | Status: DC
  Administered 2024-02-11 – 2024-02-12 (×2): 10 mg via ORAL
  Filled 2024-02-10 (×2): qty 1

## 2024-02-10 NOTE — BH Assessment (Signed)
(  Sleep Hours) - 8 (Any PRNs that were needed, meds refused, or side effects to meds)-  (Any disturbances and when (visitation, over night)- None (Concerns raised by the patient)- None (SI/HI/AVH)- Denies

## 2024-02-10 NOTE — BHH Group Notes (Signed)
 Vanessa Burgess attended the pet therapy group today 02/10/24 [phone]).

## 2024-02-10 NOTE — Plan of Care (Signed)
   Problem: Education: Goal: Emotional status will improve Outcome: Progressing Goal: Mental status will improve Outcome: Progressing

## 2024-02-10 NOTE — BHH Group Notes (Signed)
 Phillippa attended the social work group today 02/10/24 ([id number]).

## 2024-02-10 NOTE — Group Note (Signed)
 Recreation Therapy Group Note   Group Topic:Animal Assisted Therapy   Group Date: 02/10/2024 Start Time: 0950 End Time: 1030 Facilitators: Makai Dumond-McCall, LRT,CTRS Location: [address] Dayroom   Animal-Assisted Activity (AAA) Program Checklist/Progress Notes Patient Eligibility Criteria Checklist & Daily Group note for Rec Tx Intervention  AAA/T Program Assumption of Risk Form signed by Patient/ or Parent Legal Guardian Yes  Patient is free of allergies or severe asthma Yes  Patient reports no fear of animals Yes  Patient reports no history of cruelty to animals Yes  Patient understands his/her participation is voluntary Yes  Patient washes hands before animal contact Yes  Patient washes hands after animal contact Yes  Behavioral Response: Active   Education: Charity Fundraiser, Appropriate Animal Interaction   Education Outcome: Acknowledges education.    Affect/Mood: Appropriate   Participation Level: Active   Participation Quality: Independent   Behavior: Appropriate   Speech/Thought Process: None   Insight: None   Judgement: None   Modes of Intervention: Teaching Laboratory Technician   Patient Response to Interventions:  Attentive   Education Outcome:  In group clarification offered    Clinical Observations/Individualized Feedback: Pt was quiet but attentive. Pt has some good interaction with therapy dog team.     Plan: Continue to engage patient in RT group sessions 2-3x/week.   Korin Hartwell-McCall, LRT,CTRS 02/10/2024 12:46 PM

## 2024-02-10 NOTE — Plan of Care (Signed)
   Problem: Education: Goal: Knowledge of [place] [facility] Education information/materials will improve Outcome: Progressing Goal: Emotional status will improve Outcome: Progressing Goal: Mental status will improve Outcome: Progressing Goal: Verbalization of understanding the information provided will improve Outcome: Progressing

## 2024-02-10 NOTE — Group Note (Signed)
 Date:  02/10/2024 Time:  9:25 AM  Group Topic/Focus: Goals Group  Patients participated in a goals-focused group aimed at increasing self-awareness, motivation, and future planning. As an research scientist (life sciences), patients were provided a worksheet listing 50 positive traits and asked to identify several that described themselves to promote positive self-concept. Patients then completed a goal-setting worksheet outlining short- and long-term goals (1 week, 1 month, 1 year, and 5 years). Group discussion centered on sharing individual goals, identifying potential obstacles, and exploring coping strategies and problem-solving methods to overcome barriers. Patients were encouraged to use realistic, measurable goals and to practice self-compassion in the goal-setting process.   Participation Level:  Active  Participation Quality:  Appropriate, Attentive, and Sharing  Affect:  Depressed and Flat  Cognitive:  Alert and Appropriate  Insight: Good and Improving  Engagement in Group:  Engaged and Improving  Modes of Intervention:  Discussion, Exploration, and Socialization  Additional Comments:  Vanessa Burgess attended and actively participated in goals group. She was pulled by her provider at one point but returned to the group.  Vanessa Burgess 02/10/2024, 9:25 AM

## 2024-02-10 NOTE — Group Note (Signed)
 LCSW Group Therapy Note   Group Date: 02/10/2024 Start Time: 1100 End Time: 1200   Participation:  Patient was present and actively participated in the conversation.  Type of Therapy:  Group Therapy   Topic:  Understanding Your Path to Change  Objective:  The goal is to help individuals understand the stages of change, identify where they currently are in the process, and provide actionable next steps to continue moving forward in their journey of change.  Goals: - Learn about the six stages of change:  Precontemplation, Contemplation, Preparation, Action, Maintenance, and Relapse - Reflect on Current Change Efforts:  Recognize which stage participants are in regarding a personal change. - Plan Next Steps for Moving Forward:  Create an action plan based on their current stage of change.  Summary:  In this session, we explored the Stages of Change as a framework to understand the process of change.  We discussed how each stage helps individuals recognize where they are in their personal journey and used the Stages of Change Worksheet for self-reflection. Participants answered questions to better understand their current stage, challenges, and progress. We also emphasized the importance of moving forward, even if setbacks (Relapse) occur, and created actionable steps to help participants continue progressing. By the end of the session, participants gained a clearer understanding of their path to change and left with a clear plan for next steps.  Modalities:  Elements of CBT (cognitive restructuring, problem solving)  Element of DBT (mindfulness, distress tolerance)   Jeanene Mena O Elana Jian, LCSWA 02/10/2024  12:25 PM

## 2024-02-10 NOTE — Group Note (Addendum)
 Date:  02/10/2024 Time:  4:29 PM  Group Topic/Focus:  Making Healthy Choices:   The focus of this group is to help patients identify negative/unhealthy choices they were using prior to admission and identify positive/healthier coping strategies to replace them upon discharge. Self Care:   The focus of this group is to help patients understand the importance of self-care in order to improve or restore emotional, physical, spiritual, interpersonal, and financial health.    Participation Level:  Active   Participation Quality:  Appropriate   Affect:  Appropriate   Cognitive:  Appropriate   Insight: Appropriate   Engagement in Group:  Engaged   Modes of Intervention:  Discussion and Education  Vanessa Burgess 02/10/2024, 4:29 PM

## 2024-02-10 NOTE — Progress Notes (Signed)
 [facility] Inpatient Psychiatry Progress Note  Date: 02/10/24 Patient: Vanessa Burgess MRN: [id number]  Assessment and Plan: Vanessa Burgess is a 23 y/o female presented to [facility] as a walk in voluntarily  and unaccompanied with complaints of suicidal ideation with a plan to drive her car off a bridge. Patient reports that she has been feeling, depressed,irritable and angry, no motivation, and poor energy daily. The patient has struggled with depression throughout HS and early adulthood. Symptoms have worsened over the last few weeks.   11/11 - Patient continues to endorse depressed mood with anhedonia and continues to present with restricted affect. She is tolerating bupropion. We discussed starting to decrease escitalopram as she has been on it for some time without clear benefit. Potential discharge Thursday morning.   # Severe recurrent major depression without psychotic features (HCC) - Escitalopram 10 mg daily - Bupropion XL 150 mg daily   Risk Assessment - Intermediate  Discharge Planning Estimated length of stay: 2-3 days Predicted Discharge location: Home    Interval History and update: Chart reviewed. No significant events overnight. On assessment today patient reported feeling a bit irritable on account of some of the other patients on the unit, but she denied that any were being inappropriate towards her. She reported sleeping well. She denied any side effects from bupropion. She reported feeling bored and was looking forward to returning home. She denied suicidal ideation today. She reported still feeling depressed and anhedonic. We discussed her hobbies and a book she has been reading. We discussed changes she feels that would be helpful for her to make in her personal life that may help improve her mood, and she identified reducing screen time and spending more time engaging in hands-on activities as likely to be helpful.       Physical  Exam MSK/Neuro - Normal gait and station  Mental Status Exam Appearance - Casually dressed, appropriate hygiene and grooming  Attitude - Calm, polite, not guarded Speech - normal volume, prosody, inflection Mood - alright Affect - Restricted Thought Process - LLGD Thought Content - No delusional TC expressed SI/HI - Denies  Perceptions - Denies AVH; not RIS Judgement/Insight - Fair  Fund of knowledge - WNL Language - No impairments      Lab Results:  No visits with results within 1 Day(s) from this visit.  Latest known visit with results is:  Admission on 02/07/2024, Discharged on 02/08/2024  Component Date Value Ref Range Status   WBC 02/08/2024 7.0  4.0 - 10.5 K/uL Final   RBC 02/08/2024 4.26  3.87 - 5.11 MIL/uL Final   Hemoglobin 02/08/2024 12.9  12.0 - 15.0 g/dL Final   HCT 88/90/7974 38.5  36.0 - 46.0 % Final   MCV 02/08/2024 90.4  80.0 - 100.0 fL Final   MCH 02/08/2024 30.3  26.0 - 34.0 pg Final   MCHC 02/08/2024 33.5  30.0 - 36.0 g/dL Final   RDW 88/90/7974 12.6  11.5 - 15.5 % Final   Platelets 02/08/2024 289  150 - 400 K/uL Final   nRBC 02/08/2024 0.0  0.0 - 0.2 % Final   Neutrophils Relative % 02/08/2024 44  % Final   Neutro Abs 02/08/2024 3.1  1.7 - 7.7 K/uL Final   Lymphocytes Relative 02/08/2024 41  % Final   Lymphs Abs 02/08/2024 2.8  0.7 - 4.0 K/uL Final   Monocytes Relative 02/08/2024 8  % Final   Monocytes Absolute 02/08/2024 0.5  0.1 - 1.0 K/uL Final  Eosinophils Relative 02/08/2024 6  % Final   Eosinophils Absolute 02/08/2024 0.4  0.0 - 0.5 K/uL Final   Basophils Relative 02/08/2024 1  % Final   Basophils Absolute 02/08/2024 0.1  0.0 - 0.1 K/uL Final   Immature Granulocytes 02/08/2024 0  % Final   Abs Immature Granulocytes 02/08/2024 0.01  0.00 - 0.07 K/uL Final   Sodium 02/08/2024 139  135 - 145 mmol/L Final   Potassium 02/08/2024 3.8  3.5 - 5.1 mmol/L Final   Chloride 02/08/2024 105  98 - 111 mmol/L Final   CO2 02/08/2024 23  22 - 32 mmol/L Final    Glucose, Bld 02/08/2024 85  70 - 99 mg/dL Final   BUN 88/90/7974 6  6 - 20 mg/dL Final   Creatinine, Ser 02/08/2024 0.55  0.44 - 1.00 mg/dL Final   Calcium 88/90/7974 9.1  8.9 - 10.3 mg/dL Final   Total Protein 88/90/7974 7.5  6.5 - 8.1 g/dL Final   Albumin 88/90/7974 3.9  3.5 - 5.0 g/dL Final   AST 88/90/7974 19  15 - 41 U/L Final   ALT 02/08/2024 17  0 - 44 U/L Final   Alkaline Phosphatase 02/08/2024 50  38 - 126 U/L Final   Total Bilirubin 02/08/2024 1.4 (H)  0.0 - 1.2 mg/dL Final   GFR, Estimated 02/08/2024 >60  >60 mL/min Final   Anion gap 02/08/2024 11  5 - 15 Final   Magnesium 02/08/2024 2.0  1.7 - 2.4 mg/dL Final   Alcohol, Ethyl (B) 02/08/2024 <15  <15 mg/dL Final   Cholesterol 88/90/7974 157  0 - 200 mg/dL Final   Triglycerides 88/90/7974 30  <150 mg/dL Final   HDL 88/90/7974 49  >40 mg/dL Final   Total CHOL/HDL Ratio 02/08/2024 3.2  RATIO Final   VLDL 02/08/2024 6  0 - 40 mg/dL Final   LDL Cholesterol 02/08/2024 102 (H)  0 - 99 mg/dL Final   POC Amphetamine UR 02/08/2024 None Detected  NONE DETECTED (Cut Off Level 1000 ng/mL) Final   POC Secobarbital (BAR) 02/08/2024 None Detected  NONE DETECTED (Cut Off Level 300 ng/mL) Final   POC Buprenorphine (BUP) 02/08/2024 None Detected  NONE DETECTED (Cut Off Level 10 ng/mL) Final   POC Oxazepam (BZO) 02/08/2024 None Detected  NONE DETECTED (Cut Off Level 300 ng/mL) Final   POC Cocaine UR 02/08/2024 None Detected  NONE DETECTED (Cut Off Level 300 ng/mL) Final   POC Methamphetamine UR 02/08/2024 None Detected  NONE DETECTED (Cut Off Level 1000 ng/mL) Final   POC Morphine 02/08/2024 None Detected  NONE DETECTED (Cut Off Level 300 ng/mL) Final   POC Methadone UR 02/08/2024 None Detected  NONE DETECTED (Cut Off Level 300 ng/mL) Final   POC Oxycodone UR 02/08/2024 None Detected  NONE DETECTED (Cut Off Level 100 ng/mL) Final   POC Marijuana UR 02/08/2024 None Detected  NONE DETECTED (Cut Off Level 50 ng/mL) Final   Preg Test, Ur  02/08/2024 Negative  Negative Final   TSH 02/08/2024 2.543  0.350 - 4.500 uIU/mL Final     Vitals: Blood pressure 117/77, pulse 91, temperature 98.9 F (37.2 C), temperature source Oral, resp. rate 16, height 5' 4 (1.626 m), weight 77.1 kg, SpO2 100%.    [name], DO

## 2024-02-10 NOTE — Progress Notes (Signed)
   02/10/24 1100  Psych Admission Type (Psych Patients Only)  Admission Status Voluntary  Psychosocial Assessment  Patient Complaints None  Eye Contact Fair  Facial Expression Animated  Affect Appropriate to circumstance  Speech Logical/coherent  Interaction Assertive  Motor Activity Other (Comment)  Appearance/Hygiene Unremarkable  Behavior Characteristics Appropriate to situation  Mood Pleasant  Thought Process  Coherency WDL  Content WDL  Delusions None reported or observed  Perception WDL  Hallucination None reported or observed  Judgment Poor  Confusion None  Danger to Self  Current suicidal ideation? Denies  Danger to Others  Danger to Others None reported or observed

## 2024-02-10 NOTE — [facility] Group Notes (Signed)
 [facility] Group Notes:  (Nursing/MHT/Case Management/Adjunct)  Date:  02/10/2024  Time:  9:31 PM  Type of Therapy:  Wrap-up group  Participation Level:  Active  Participation Quality:  Appropriate  Affect:  Appropriate  Cognitive:  Appropriate  Insight:  Appropriate  Engagement in Group:  Engaged  Modes of Intervention:  Education  Summary of Progress/Problems: Goal to be more active, not met. Rated day 4/10.  Vanessa Burgess [place] 02/10/2024, 9:31 PM

## 2024-02-11 NOTE — Group Note (Signed)
 Date:  02/11/2024 Time:  10:20 AM  Group Topic/Focus:  Goals Group:   The focus of this group is to help patients establish daily goals to achieve during treatment and discuss how the patient can incorporate goal setting into their daily lives to aide in recovery. Orientation:   The focus of this group is to educate the patient on the purpose and policies of crisis stabilization and provide a format to answer questions about their admission.  The group details unit policies and expectations of patients while admitted.    Participation Level:  Did Not Attend   Vanessa Burgess Mars 02/11/2024, 10:20 AM

## 2024-02-11 NOTE — Group Note (Signed)
 Date:  02/11/2024 Time:  11:43 AM  Group Topic/Focus:   Recreational Therapy: The focus of this group was to discuss leisure with a hands on activity meant to help patients explore their physical, emotional, cognitive and/or social wellbeing.  Participation Level:  Did Not Attend   Vanessa Burgess 02/11/2024, 11:43 AM

## 2024-02-11 NOTE — [facility] Group Notes (Signed)
 [facility] Group Notes:  (Nursing/MHT/Case Management/Adjunct)  Date:  02/11/2024  Time:  9:14 PM  Type of Therapy:  NA Group  Participation Level:  Active  Participation Quality:  Appropriate  Affect:  Appropriate  Cognitive:  Appropriate  Insight:  Appropriate  Engagement in Group:    Modes of Intervention:  Education  Summary of Progress/Problems: Attended NA meeting.  Vanessa Burgess 02/11/2024, 9:14 PM

## 2024-02-11 NOTE — [facility] Suicide Risk Assessment (Signed)
 [facility] INPATIENT:  Family/Significant Other Suicide Prevention Education  Suicide Prevention Education:  Education Completed; Ludmilla Mcgillis (grandfather) [phone],  (name of family member/significant other) has been identified by the patient as the family member/significant other with whom the patient will be residing, and identified as the person(s) who will aid the patient in the event of a mental health crisis (suicidal ideations/suicide attempt).  With written consent from the patient, the family member/significant other has been provided the following suicide prevention education, prior to the and/or following the discharge of the patient.  The suicide prevention education provided includes the following: Suicide risk factors Suicide prevention and interventions National Suicide Hotline telephone number [facility] assessment telephone number [facility] Emergency Assistance 911 [facility] and/or Residential Mobile Crisis Unit telephone number  Request made of family/significant other to: Remove weapons (e.g., guns, rifles, knives), all items previously/currently identified as safety concern.   Remove drugs/medications (over-the-counter, prescriptions, illicit drugs), all items previously/currently identified as a safety concern.  Vanessa Burgess states there are no weapons in the home and agrees to the requests listed above. Vanessa Burgess is aware of Vanessa Burgess's follow up care and agrees to be a support for the patient. Vanessa Burgess will provide transportation upon the patient's discharge.  The family member/significant other verbalizes understanding of the suicide prevention education information provided.  The family member/significant other agrees to remove the items of safety concern listed above.  Vanessa Burgess 02/11/2024, 1:09 PM

## 2024-02-11 NOTE — Progress Notes (Signed)
 [facility] Inpatient Psychiatry Progress Note  Date: 02/11/24 Patient: Vanessa Burgess MRN: [id number]  Assessment and Plan: Vanessa Burgess is a 23 y/o female presented to [facility] as a walk in voluntarily  and unaccompanied with complaints of suicidal ideation with a plan to drive her car off a bridge. Patient reports that she has been feeling, depressed,irritable and angry, no motivation, and poor energy daily. The patient has struggled with depression throughout HS and early adulthood. Symptoms have worsened over the last few weeks.   11/12 - Significant improvement. Plan for discharge tomorrow morning.   # Severe recurrent major depression without psychotic features (HCC) - Escitalopram 10 mg daily - Bupropion XL 150 mg daily   Risk Assessment - Low  Discharge Planning Estimated length of stay: 1 day Predicted Discharge location: Home    Interval History and update: Chart reviewed. No significant events overnight. Patient reported feeling better today compared to yesterday. She denied SI. She still endorsed feeling moderately depressed but reported feeling less anxious today. No change in symptoms since decreasing escitalopram to 10 mg and she denies any side effects from bupropion. Discussed discharge plans for tomorrow and she will be following up with [facility] for therapy.       Physical Exam MSK/Neuro - Normal gait and station  Mental Status Exam Appearance - Casually dressed, appropriate hygiene and grooming  Attitude - Calm, polite, not guarded Speech - normal volume, prosody, inflection Mood - okay Affect - Restricted Thought Process - LLGD Thought Content - No delusional TC expressed SI/HI - Denies  Perceptions - Denies AVH; not RIS Judgement/Insight - Fair  Fund of knowledge - WNL Language - No impairments      Lab Results:  No visits with results within 1 Day(s) from this visit.  Latest known visit with results is:   Admission on 02/07/2024, Discharged on 02/08/2024  Component Date Value Ref Range Status   WBC 02/08/2024 7.0  4.0 - 10.5 K/uL Final   RBC 02/08/2024 4.26  3.87 - 5.11 MIL/uL Final   Hemoglobin 02/08/2024 12.9  12.0 - 15.0 g/dL Final   HCT 88/90/7974 38.5  36.0 - 46.0 % Final   MCV 02/08/2024 90.4  80.0 - 100.0 fL Final   MCH 02/08/2024 30.3  26.0 - 34.0 pg Final   MCHC 02/08/2024 33.5  30.0 - 36.0 g/dL Final   RDW 88/90/7974 12.6  11.5 - 15.5 % Final   Platelets 02/08/2024 289  150 - 400 K/uL Final   nRBC 02/08/2024 0.0  0.0 - 0.2 % Final   Neutrophils Relative % 02/08/2024 44  % Final   Neutro Abs 02/08/2024 3.1  1.7 - 7.7 K/uL Final   Lymphocytes Relative 02/08/2024 41  % Final   Lymphs Abs 02/08/2024 2.8  0.7 - 4.0 K/uL Final   Monocytes Relative 02/08/2024 8  % Final   Monocytes Absolute 02/08/2024 0.5  0.1 - 1.0 K/uL Final   Eosinophils Relative 02/08/2024 6  % Final   Eosinophils Absolute 02/08/2024 0.4  0.0 - 0.5 K/uL Final   Basophils Relative 02/08/2024 1  % Final   Basophils Absolute 02/08/2024 0.1  0.0 - 0.1 K/uL Final   Immature Granulocytes 02/08/2024 0  % Final   Abs Immature Granulocytes 02/08/2024 0.01  0.00 - 0.07 K/uL Final   Sodium 02/08/2024 139  135 - 145 mmol/L Final   Potassium 02/08/2024 3.8  3.5 - 5.1 mmol/L Final   Chloride 02/08/2024 105  98 - 111  mmol/L Final   CO2 02/08/2024 23  22 - 32 mmol/L Final   Glucose, Bld 02/08/2024 85  70 - 99 mg/dL Final   BUN 88/90/7974 6  6 - 20 mg/dL Final   Creatinine, Ser 02/08/2024 0.55  0.44 - 1.00 mg/dL Final   Calcium 88/90/7974 9.1  8.9 - 10.3 mg/dL Final   Total Protein 88/90/7974 7.5  6.5 - 8.1 g/dL Final   Albumin 88/90/7974 3.9  3.5 - 5.0 g/dL Final   AST 88/90/7974 19  15 - 41 U/L Final   ALT 02/08/2024 17  0 - 44 U/L Final   Alkaline Phosphatase 02/08/2024 50  38 - 126 U/L Final   Total Bilirubin 02/08/2024 1.4 (H)  0.0 - 1.2 mg/dL Final   GFR, Estimated 02/08/2024 >60  >60 mL/min Final   Anion gap  02/08/2024 11  5 - 15 Final   Magnesium 02/08/2024 2.0  1.7 - 2.4 mg/dL Final   Alcohol, Ethyl (B) 02/08/2024 <15  <15 mg/dL Final   Cholesterol 88/90/7974 157  0 - 200 mg/dL Final   Triglycerides 88/90/7974 30  <150 mg/dL Final   HDL 88/90/7974 49  >40 mg/dL Final   Total CHOL/HDL Ratio 02/08/2024 3.2  RATIO Final   VLDL 02/08/2024 6  0 - 40 mg/dL Final   LDL Cholesterol 02/08/2024 102 (H)  0 - 99 mg/dL Final   POC Amphetamine UR 02/08/2024 None Detected  NONE DETECTED (Cut Off Level 1000 ng/mL) Final   POC Secobarbital (BAR) 02/08/2024 None Detected  NONE DETECTED (Cut Off Level 300 ng/mL) Final   POC Buprenorphine (BUP) 02/08/2024 None Detected  NONE DETECTED (Cut Off Level 10 ng/mL) Final   POC Oxazepam (BZO) 02/08/2024 None Detected  NONE DETECTED (Cut Off Level 300 ng/mL) Final   POC Cocaine UR 02/08/2024 None Detected  NONE DETECTED (Cut Off Level 300 ng/mL) Final   POC Methamphetamine UR 02/08/2024 None Detected  NONE DETECTED (Cut Off Level 1000 ng/mL) Final   POC Morphine 02/08/2024 None Detected  NONE DETECTED (Cut Off Level 300 ng/mL) Final   POC Methadone UR 02/08/2024 None Detected  NONE DETECTED (Cut Off Level 300 ng/mL) Final   POC Oxycodone UR 02/08/2024 None Detected  NONE DETECTED (Cut Off Level 100 ng/mL) Final   POC Marijuana UR 02/08/2024 None Detected  NONE DETECTED (Cut Off Level 50 ng/mL) Final   Preg Test, Ur 02/08/2024 Negative  Negative Final   TSH 02/08/2024 2.543  0.350 - 4.500 uIU/mL Final     Vitals: Blood pressure 96/75, pulse 83, temperature 98.9 F (37.2 C), temperature source Oral, resp. rate 16, height 5' 4 (1.626 m), weight 77.1 kg, SpO2 100%.    Vanessa DELENA Salmon, DO

## 2024-02-11 NOTE — Group Note (Signed)
 Recreation Therapy Group Note   Group Topic:Leisure Education  Group Date: 02/11/2024 Start Time: 0933 End Time: 1004 Facilitators: Lakeyn Dokken-McCall, LRT,CTRS Location: [address] Dayroom   Group Topic: Leisure Education    Goal Area(s) Addresses:  Patient will successfully identify benefits of leisure participation. Patient will successfully identify ways to access leisure activities. Patient will identify what leisure is.  Behavioral Response:    Intervention: Poster Making   Activity: Patients and LRT discussed what leisure was and it's purpose. Patients had to option of working alone or with a partner to create a poster about leisure in general or a specific activity. In that poster, patients were to identify the benefits, types of leisure and importance of it. Patients shared their posters with peers during processing. Patients were debriefed on being active in leisure, and also how leisure can still provide you with lessons to help you learn.   Education:  Leisure Education, Publishing Copy Outcome: Acknowledges education   Affect/Mood: N/A   Participation Level: Did not attend    Clinical Observations/Individualized Feedback:     Plan: Continue to engage patient in RT group sessions 2-3x/week.   Krissy Orebaugh-McCall, LRT,CTRS 02/11/2024 12:22 PM

## 2024-02-11 NOTE — Plan of Care (Signed)
   Problem: Education: Goal: Knowledge of [facility] Education information/materials will improve Outcome: Progressing   Problem: Activity: Goal: Interest or engagement in activities will improve Outcome: Progressing   Problem: Coping: Goal: Ability to verbalize frustrations and anger appropriately will improve Outcome: Progressing   Problem: Safety: Goal: Periods of time without injury will increase Outcome: Progressing

## 2024-02-11 NOTE — Group Note (Signed)
 Date:  02/11/2024 Time:  12:54 PM  Group Topic/Focus:  Pharmacy Group   Pt did attend pharmacy group   Vanessa Burgess D Aviel Davalos 02/11/2024, 12:54 PM

## 2024-02-11 NOTE — Progress Notes (Signed)

## 2024-02-11 NOTE — Group Note (Signed)
 Date:  02/11/2024 Time:  4:45 PM  Group Topic/Focus: 5 Love languages Healthy Communication:   The focus of this group is to discuss communication, barriers to communication, as well as healthy ways to communicate with others.    Participation Level:  Active  Participation Quality:  Appropriate  Affect:  Appropriate  Cognitive:  Appropriate  Insight: Appropriate  Engagement in Group:  Engaged  Modes of Intervention:  Discussion and Education  Additional Comments:    Vanessa Burgess 02/11/2024, 4:45 PM

## 2024-02-11 NOTE — Plan of Care (Signed)
   Problem: Education: Goal: Emotional status will improve Outcome: Progressing Goal: Mental status will improve Outcome: Progressing Goal: Verbalization of understanding the information provided will improve Outcome: Progressing

## 2024-02-11 NOTE — BH Assessment (Signed)
(  Sleep Hours) - 9 (Any PRNs that were needed, meds refused, or side effects to meds)-  (Any disturbances and when (visitation, over night)- None (Concerns raised by the patient)- None (SI/HI/AVH)- Denies

## 2024-02-11 NOTE — Group Note (Signed)
 Date:  02/11/2024 Time:  4:33 PM  Group Topic/Focus: Spiritual Wellness (By Elia)    Participation Level:  Did Not Attend    Heron Pitcock D Hiroyuki Ozanich 02/11/2024, 4:33 PM

## 2024-02-11 NOTE — Progress Notes (Signed)
  [facility] Adult Case Management Discharge Plan :  Will you be returning to the same living situation after discharge:  Yes,  patient will be returning home to address on file. At discharge, do you have transportation home?: Yes,  patient's grandfather, Steffany Schoenfelder, will be providing transportation at 9am.  Do you have the ability to pay for your medications: Yes,  patient has active health insurance.  Release of information consent forms completed and in the chart;  Patient's signature needed at discharge.  Patient to Follow up at:  Follow-up Information     [facility] Follow up on 02/20/2024.   Why: You have a hospital follow up appointment for therapy and medication management services on 02/20/24 at 8:00 am.  The appointment will be Virtual, telehealth. Contact information: [address]  [address] [place] [address] [phone]                 Next level of care provider has access to [facility] Link:no  Safety Planning and Suicide Prevention discussed: Yes,  completed with Nikole Swartzentruber (grandfather) [phone].     Has patient been referred to the Quitline?: Patient does not use tobacco/nicotine products  Patient has been referred for addiction treatment: No known substance use disorder.  Louetta Lame, LCSWA 02/11/2024, 3:34 PM

## 2024-02-11 NOTE — Plan of Care (Signed)
   Problem: Education: Goal: Knowledge of [place] [facility] Education information/materials will improve Outcome: Progressing Goal: Emotional status will improve Outcome: Progressing Goal: Mental status will improve Outcome: Progressing Goal: Verbalization of understanding the information provided will improve Outcome: Progressing

## 2024-02-12 DIAGNOSIS — F332 Major depressive disorder, recurrent severe without psychotic features: Principal | ICD-10-CM

## 2024-02-12 MED ORDER — ESCITALOPRAM OXALATE 10 MG PO TABS: 10.0000 mg | ORAL_TABLET | Freq: Every day | ORAL | 0 refills | Status: AC

## 2024-02-12 MED ORDER — BUPROPION HCL ER (XL) 150 MG PO TB24
150.0000 mg | ORAL_TABLET | Freq: Every day | ORAL | 0 refills | Status: AC
Start: 2024-02-12 — End: ?

## 2024-02-12 NOTE — Plan of Care (Signed)
 Problem: Education: Goal: Knowledge of [facility] Education information/materials will improve Outcome: Adequate for Discharge Goal: Emotional status will improve Outcome: Adequate for Discharge Goal: Mental status will improve Outcome: Adequate for Discharge Goal: Verbalization of understanding the information provided will improve Outcome: Adequate for Discharge   Problem: Activity: Goal: Interest or engagement in activities will improve Outcome: Adequate for Discharge Goal: Sleeping patterns will improve Outcome: Adequate for Discharge   Problem: Coping: Goal: Ability to verbalize frustrations and anger appropriately will improve Outcome: Adequate for Discharge Goal: Ability to demonstrate self-control will improve Outcome: Adequate for Discharge   Problem: Health Behavior/Discharge Planning: Goal: Identification of resources available to assist in meeting health care needs will improve Outcome: Adequate for Discharge Goal: Compliance with treatment plan for underlying cause of condition will improve Outcome: Adequate for Discharge   Problem: Physical Regulation: Goal: Ability to maintain clinical measurements within normal limits will improve Outcome: Adequate for Discharge   Problem: Safety: Goal: Periods of time without injury will increase Outcome: Adequate for Discharge   Problem: Education: Goal: Knowledge of [facility] Education information/materials will improve Outcome: Adequate for Discharge Goal: Emotional status will improve Outcome: Adequate for Discharge Goal: Mental status will improve Outcome: Adequate for Discharge Goal: Verbalization of understanding the information provided will improve Outcome: Adequate for Discharge   Problem: Activity: Goal: Interest or engagement in activities will improve Outcome: Adequate for Discharge Goal: Sleeping patterns will improve Outcome: Adequate for Discharge   Problem: Coping: Goal:  Ability to verbalize frustrations and anger appropriately will improve Outcome: Adequate for Discharge Goal: Ability to demonstrate self-control will improve Outcome: Adequate for Discharge   Problem: Health Behavior/Discharge Planning: Goal: Identification of resources available to assist in meeting health care needs will improve Outcome: Adequate for Discharge Goal: Compliance with treatment plan for underlying cause of condition will improve Outcome: Adequate for Discharge   Problem: Physical Regulation: Goal: Ability to maintain clinical measurements within normal limits will improve Outcome: Adequate for Discharge   Problem: Safety: Goal: Periods of time without injury will increase Outcome: Adequate for Discharge   Problem: Education: Goal: Ability to make informed decisions regarding treatment will improve Outcome: Adequate for Discharge   Problem: Coping: Goal: Coping ability will improve Outcome: Adequate for Discharge   Problem: Health Behavior/Discharge Planning: Goal: Identification of resources available to assist in meeting health care needs will improve Outcome: Adequate for Discharge   Problem: Medication: Goal: Compliance with prescribed medication regimen will improve Outcome: Adequate for Discharge   Problem: Self-Concept: Goal: Ability to disclose and discuss suicidal ideas will improve Outcome: Adequate for Discharge Goal: Will verbalize positive feelings about self Outcome: Adequate for Discharge   Problem: Education: Goal: Ability to state activities that reduce stress will improve Outcome: Adequate for Discharge   Problem: Coping: Goal: Ability to identify and develop effective coping behavior will improve Outcome: Adequate for Discharge   Problem: Self-Concept: Goal: Ability to identify factors that promote anxiety will improve Outcome: Adequate for Discharge Goal: Level of anxiety will decrease Outcome: Adequate for Discharge Goal:  Ability to modify response to factors that promote anxiety will improve Outcome: Adequate for Discharge   Problem: Education: Goal: Utilization of techniques to improve thought processes will improve Outcome: Adequate for Discharge Goal: Knowledge of the prescribed therapeutic regimen will improve Outcome: Adequate for Discharge   Problem: Activity: Goal: Interest or engagement in leisure activities will improve Outcome: Adequate for Discharge Goal: Imbalance in normal sleep/wake cycle will improve Outcome: Adequate for Discharge  Problem: Coping: Goal: Coping ability will improve Outcome: Adequate for Discharge Goal: Will verbalize feelings Outcome: Adequate for Discharge   Problem: Health Behavior/Discharge Planning: Goal: Ability to make decisions will improve Outcome: Adequate for Discharge Goal: Compliance with therapeutic regimen will improve Outcome: Adequate for Discharge   Problem: Role Relationship: Goal: Will demonstrate positive changes in social behaviors and relationships Outcome: Adequate for Discharge   Problem: Safety: Goal: Ability to disclose and discuss suicidal ideas will improve Outcome: Adequate for Discharge Goal: Ability to identify and utilize support systems that promote safety will improve Outcome: Adequate for Discharge   Problem: Self-Concept: Goal: Will verbalize positive feelings about self Outcome: Adequate for Discharge Goal: Level of anxiety will decrease Outcome: Adequate for Discharge

## 2024-02-12 NOTE — Group Note (Signed)
 Date:  02/12/2024 Time:  9:28 AM  Group Topic/Focus:  Goals Group:   The focus of this group is to help patients establish daily goals to achieve during treatment and discuss how the patient can incorporate goal setting into their daily lives to aide in recovery.    Participation Level:  Did Not Attend  Participation Quality:  NA  Affect:  NA  Cognitive:  NA  Insight: NA  Engagement in Group:  NA  Modes of Intervention:  NA  Additional Comments:  Patient did not attend group.  Rosaleen BIRCH Adalee Kathan 02/12/2024, 9:28 AM

## 2024-02-12 NOTE — BHH Suicide Risk Assessment (Signed)
 [facility] Discharge Suicide Risk Assessment   Principal Problem: Severe recurrent major depression without psychotic features [facility]) Discharge Diagnoses: Principal Problem:   Severe recurrent major depression without psychotic features (HCC)    Demographic Factors:  Adolescent or young adult and Caucasian  Loss Factors: NA  Historical Factors: NA  Risk Reduction Factors:   Sense of responsibility to family, Employed, Living with another person, especially a relative, and Positive social support  Continued Clinical Symptoms:  Major Depressive Disorder  Cognitive Features That Contribute To Risk:  None    Suicide Risk:  Mild:  Suicidal ideation of limited frequency, intensity, duration, and specificity.  There are no identifiable plans, no associated intent, mild dysphoria and related symptoms, good self-control (both objective and subjective assessment), few other risk factors, and identifiable protective factors, including available and accessible social support.   Follow-up Information     [facility] Follow up on 02/20/2024.   Why: You have a hospital follow up appointment for therapy and medication management services on 02/20/24 at 8:00 am.  The appointment will be Virtual, telehealth. Contact information: [address]  [address] [place] [address] [phone]                  Vanessa DELENA Salmon, DO 02/12/2024, 8:36 AM

## 2024-02-12 NOTE — Progress Notes (Signed)
   02/12/24 0900  Psych Admission Type (Psych Patients Only)  Admission Status Voluntary  Psychosocial Assessment  Patient Complaints None  Eye Contact Fair  Facial Expression Flat;Blank  Affect Appropriate to circumstance  Speech Logical/coherent  Interaction Assertive  Motor Activity Slow  Appearance/Hygiene Unremarkable  Behavior Characteristics Cooperative;Appropriate to situation  Mood Pleasant  Thought Process  Coherency WDL  Content WDL  Delusions None reported or observed  Perception WDL  Hallucination None reported or observed  Judgment Poor  Confusion None  Danger to Self  Current suicidal ideation? Denies  Description of Suicide Plan No Plan  Agreement Not to Harm Self Yes  Description of Agreement Verbal  Danger to Others  Danger to Others None reported or observed

## 2024-02-12 NOTE — Progress Notes (Signed)
 Pt discharged to lobby. Pt was stable and appreciative at that time. All papers and prescriptions were given and valuables returned. Verbal understanding expressed. Denies SI/HI and A/VH. Pt given opportunity to express concerns and ask questions.

## 2024-02-12 NOTE — Discharge Summary (Signed)
 Physician Discharge Summary Note  Patient:  Vanessa Burgess is an 23 y.o., female MRN:  [id number] DOB:  [birth year] Patient phone:  There is no home phone number on file.  Patient address:   [address] [place] [address],  Total Time spent with patient: 45 minutes  Date of Admission:  02/08/2024 Date of Discharge: 02/12/2024  Reason for Admission:  The patient was admitted for increasing depressive symptoms and suicidal ideation with thoughts of ramming her car into a pole.   Principal Problem: Severe recurrent major depression without psychotic features [facility]) Discharge Diagnoses: Principal Problem:   Severe recurrent major depression without psychotic features (HCC)   Past Psychiatric History:  hx of cutting as teenager -no SA -previous therapy as a teenager -Previous Rx trials: lexapro, zoloft, adderal and vyvanse -Current Rx: Lexapro 20 for 1 year  Past Medical History: History reviewed. No pertinent past medical history. History reviewed. No pertinent surgical history. Family History: History reviewed. No pertinent family history. Hospital Course:  The patient was admitted voluntarily to inpatient psychiatry for increasing symptoms of depression and suicidal ideation with a plan. Upon admission she was started on Wellbutrin XL 150 mg daily and she was continued on escitalopram 20 mg daily. Escitalopram was later decreased to 10 mg daily due to reported lack of efficacy by the patient. Over the ensuing few days the patient reports a gradual resolution of suicidal ideation and decrease in depressive symptoms. She was visible in the milieu and participated in groups. She demonstrated safe and appropriate behaviors and interacted appropriately with staff and peers. By discharge she endorsed a moderate reduction in depressive symptoms and increased optimism for further improvement. She was given a referral for outpatient therapy and planned on continuing to see her  outpatient psychiatrist. On the day of discharge she reported a mildly depressed mood without suicidal ideation. No significant medical issues presented or were addressed during this admission.     MSK/Neuro - Normal gait and station  Mental Status Exam Appearance - Casually dressed, appropriate hygiene and grooming  Attitude - Calm, polite, not guarded Speech - normal volume, prosody, inflection Mood - okay Affect - mildly restricted, appropriately reactive Thought Process - LLGD Thought Content - No delusional TC expressed SI/HI - Denies  Perceptions - Denies AVH; not RIS Judgement/Insight - Fair  Fund of knowledge - WNL Language - No impairments  Physical Exam Constitutional:      Appearance: Normal appearance.  HENT:     Head: Normocephalic and atraumatic.  Eyes:     Extraocular Movements: Extraocular movements intact.  Pulmonary:     Effort: Pulmonary effort is normal.  Musculoskeletal:        General: Normal range of motion.  Skin:    General: Skin is warm and dry.  Neurological:     General: No focal deficit present.     Mental Status: She is alert and oriented to person, place, and time.    Review of Systems  Constitutional: Negative.   Respiratory: Negative.    Cardiovascular: Negative.    Blood pressure 107/67, pulse 81, temperature 97.7 F (36.5 C), temperature source Oral, resp. rate 16, height 5' 4 (1.626 m), weight 77.1 kg, SpO2 100%. Body mass index is 29.18 kg/m.   Social History   Tobacco Use  Smoking Status Never  Smokeless Tobacco Never   Tobacco Cessation:  N/A, patient does not currently use tobacco products   Blood Alcohol level:  Lab Results  Component Value Date  ETH <15 02/08/2024    Metabolic Disorder Labs:  No results found for: HGBA1C, MPG No results found for: PROLACTIN Lab Results  Component Value Date   CHOL 157 02/08/2024   TRIG 30 02/08/2024   HDL 49 02/08/2024   CHOLHDL 3.2 02/08/2024   VLDL 6  02/08/2024   LDLCALC 102 (H) 02/08/2024    See Psychiatric Specialty Exam and Suicide Risk Assessment completed by Attending Physician prior to discharge.  Discharge destination:  Home  Is patient on multiple antipsychotic therapies at discharge:  No     Allergies as of 02/12/2024   No Known Allergies      Medication List     TAKE these medications      Indication  buPROPion 150 MG 24 hr tablet Commonly known as: WELLBUTRIN XL Take 1 tablet (150 mg total) by mouth daily.  Indication: Major Depressive Disorder   escitalopram 10 MG tablet Commonly known as: LEXAPRO Take 1 tablet (10 mg total) by mouth daily. What changed:  medication strength how much to take  Indication: Major Depressive Disorder   norethindrone-ethinyl estradiol-iron 1.5-30 MG-MCG tablet Commonly known as: LOESTRIN FE Take 1 tablet by mouth daily.  Indication: Birth Control Treatment        Follow-up Information     [facility] Follow up on 02/20/2024.   Why: You have a hospital follow up appointment for therapy and medication management services on 02/20/24 at 8:00 am.  The appointment will be Virtual, telehealth. Contact information: [address]  [address] [place] [address] [phone]                 Follow-up recommendations:  Activity:  As tolerated Diet:  regular   Signed: [name], DO 02/12/2024, 8:45 AM

## 2024-02-12 NOTE — Progress Notes (Signed)
(  Sleep Hours) - 8.5 (Any PRNs that were needed, meds refused, or side effects to meds)- Vistaril, Trazodone (Any disturbances and when (visitation, over night)- n/a (Concerns raised by the patient)- none (SI/HI/AVH)- Denies

## 2024-02-16 NOTE — Progress Notes (Signed)
